# Patient Record
Sex: Male | Born: 1980 | Race: White | Hispanic: No | Marital: Married | Smoking: Never smoker
Health system: Southern US, Community
[De-identification: ages and names within clinical notes are randomized; demographics above are authoritative.]

## PROBLEM LIST (undated history)

## (undated) DIAGNOSIS — R1310 Dysphagia, unspecified: Secondary | ICD-10-CM

## (undated) DIAGNOSIS — K625 Hemorrhage of anus and rectum: Secondary | ICD-10-CM

## (undated) DIAGNOSIS — R7989 Other specified abnormal findings of blood chemistry: Secondary | ICD-10-CM

## (undated) HISTORY — DX: Dysphagia, unspecified: R13.10

## (undated) HISTORY — PX: WISDOM TOOTH EXTRACTION: SHX21

## (undated) HISTORY — DX: Hemorrhage of anus and rectum: K62.5

---

## 2009-12-08 ENCOUNTER — Emergency Department (HOSPITAL_COMMUNITY): Admission: EM | Admit: 2009-12-08 | Discharge: 2009-12-08 | Payer: Self-pay | Admitting: Emergency Medicine

## 2009-12-28 ENCOUNTER — Ambulatory Visit: Payer: Self-pay | Admitting: Internal Medicine

## 2009-12-29 DIAGNOSIS — K921 Melena: Secondary | ICD-10-CM | POA: Insufficient documentation

## 2009-12-29 DIAGNOSIS — R1319 Other dysphagia: Secondary | ICD-10-CM | POA: Insufficient documentation

## 2009-12-30 ENCOUNTER — Ambulatory Visit (HOSPITAL_COMMUNITY): Admission: RE | Admit: 2009-12-30 | Discharge: 2009-12-30 | Payer: Self-pay | Admitting: Internal Medicine

## 2009-12-30 ENCOUNTER — Ambulatory Visit: Payer: Self-pay | Admitting: Internal Medicine

## 2010-02-21 ENCOUNTER — Encounter: Payer: Self-pay | Admitting: Internal Medicine

## 2010-09-27 NOTE — Letter (Signed)
Summary: [facility]/EGD WITH ED ORDER  [facility]/EGD WITH ED ORDER   Imported By: Ave Filter 12/28/2009 11:18:04  _____________________________________________________________________  External Attachment:    Type:   Image     Comment:   External Document

## 2010-09-27 NOTE — Assessment & Plan Note (Signed)
Summary: Esophageal Stricture- cdg   Visit Type:  Initial Visit Primary Care Provider:  none  Chief Complaint:  esoph. stricture.  History of Present Illness: Pleasant 30 year old male Thomas Andrade referred by Thomas Andrade in the ED for further evaluation of a one-year history of progressive esophageal dysphagia to solids. He tells me that over the past one year he has  had more difficulty swallowing solid food, feels food bolus stick behind his breast bone; He's had a Heimlich maneuver performed twice. He's call 911 once. He was evaluated in the ED with plain films. Has not had a GI evaluation/ barium study, etc.;  doesn't have any reflux symptoms prior to this past year; he denies any swallowing difficulties; has not had any abdominal pain, early satiety nausea or vomiting.  He has lost about 10 pounds over the past few months because his diet has really dwindled down to liquids. He rarely consumes alcohol; he does not use tobacco products. He denies family history of GI problems including neoplasia.  Also, patient reports intermittent painless hematochezia. He does work out and [employer] regularly.   Preventive Screening-Counseling & Management  Alcohol-Tobacco     Smoking Status: never  Current Medications (verified): 1)  None  Allergies (verified): 1)  ! * Shellfish 2)  ! Pcn  Past History:  Past Surgical History: none  Family History: Father: htn Mother: breast cancer Siblings: 1 sister No FH of Colon Cancer:  Social History: Marital Status: no Children: no Occupation: [employer] Patient has never smoked.  Alcohol Use - yes- occ. Smoking Status:  never  Vital Signs:  Patient profile:   30 year old male Height:      71 inches Weight:      217 pounds BMI:     30.37 Temp:     98.2 degrees F oral Pulse rate:   68 / minute BP sitting:   128 / 88  (left arm) Cuff size:   regular  Vitals Entered By: Thomas Andrade (Dec 28, 452 10:31  AM)  Physical Exam  General:  alert conversant gentleman come in by his friend Eyes:  no scleral icterus. Conjunctiva are pink Lungs:  clear to auscultation  Heart:  regular rate and rhythm without murmur gallop rub Abdomen:  nondistended positive bowel sounds soft nontender without mass or organomegaly Rectal:  no external lesions good sphincter tone no mass the rectal vault no stool in rectal vault mucus Hemoccult-negative  Impression & Recommendations: Impression: Pleasant 30 year old gentleman with a one-year history of insidiously progressive esophageal dysphagia to solid food. There is a pauscity of reflux symptoms in the history. He likely does have a mechanical lesion producing partial obstruction. Differential diagnosis would include eosinophilic esophagitis(which may be most likely), esophageal web, stricture or exceedingly unlikely neoplastic process.  As a separate issue, he has painless bright red blood per rectum on a regular basis. More likely this is related to benign anorectal problem. Weightlifting may be contributing to this symptom. However, it warrants further evaluation.  Recommendations: Diagnostic EGD with esophageal dilation as appropriate and colonoscopy in the near future the hospital. Risks, benefits, limitations, alternatives and imponderables have been reviewed. His questions have been answered he is agreeable. We'll make further recommendations in the very near future. I want to thank Thomas Andrade for his kind referral.  Appended Document: Orders Update    Clinical Lists Changes  Problems: Added new problem of DYSPHAGIA (ICD-787.29) Added new problem of HEMATOCHEZIA (ICD-578.1) Orders:  Added new Service order of Consultation Level IV [phone]) - Signed

## 2010-09-27 NOTE — Letter (Signed)
Summary: office note-consult from [facility] [place]-dr kozlow   office note-consult from [facility] [place]-dr kozlow   Imported By: Rosine Beat 02/21/2010 10:52:10  _____________________________________________________________________  External Attachment:    Type:   Image     Comment:   External Document

## 2011-10-11 IMAGING — CR DG CHEST 2V
2 series · 2 of 2 positions shown · non-contrast
Comparison: None.

CLINICAL DATA: Chest pain,

CHEST - 2 VIEW

[view not recorded (1 of 2)]
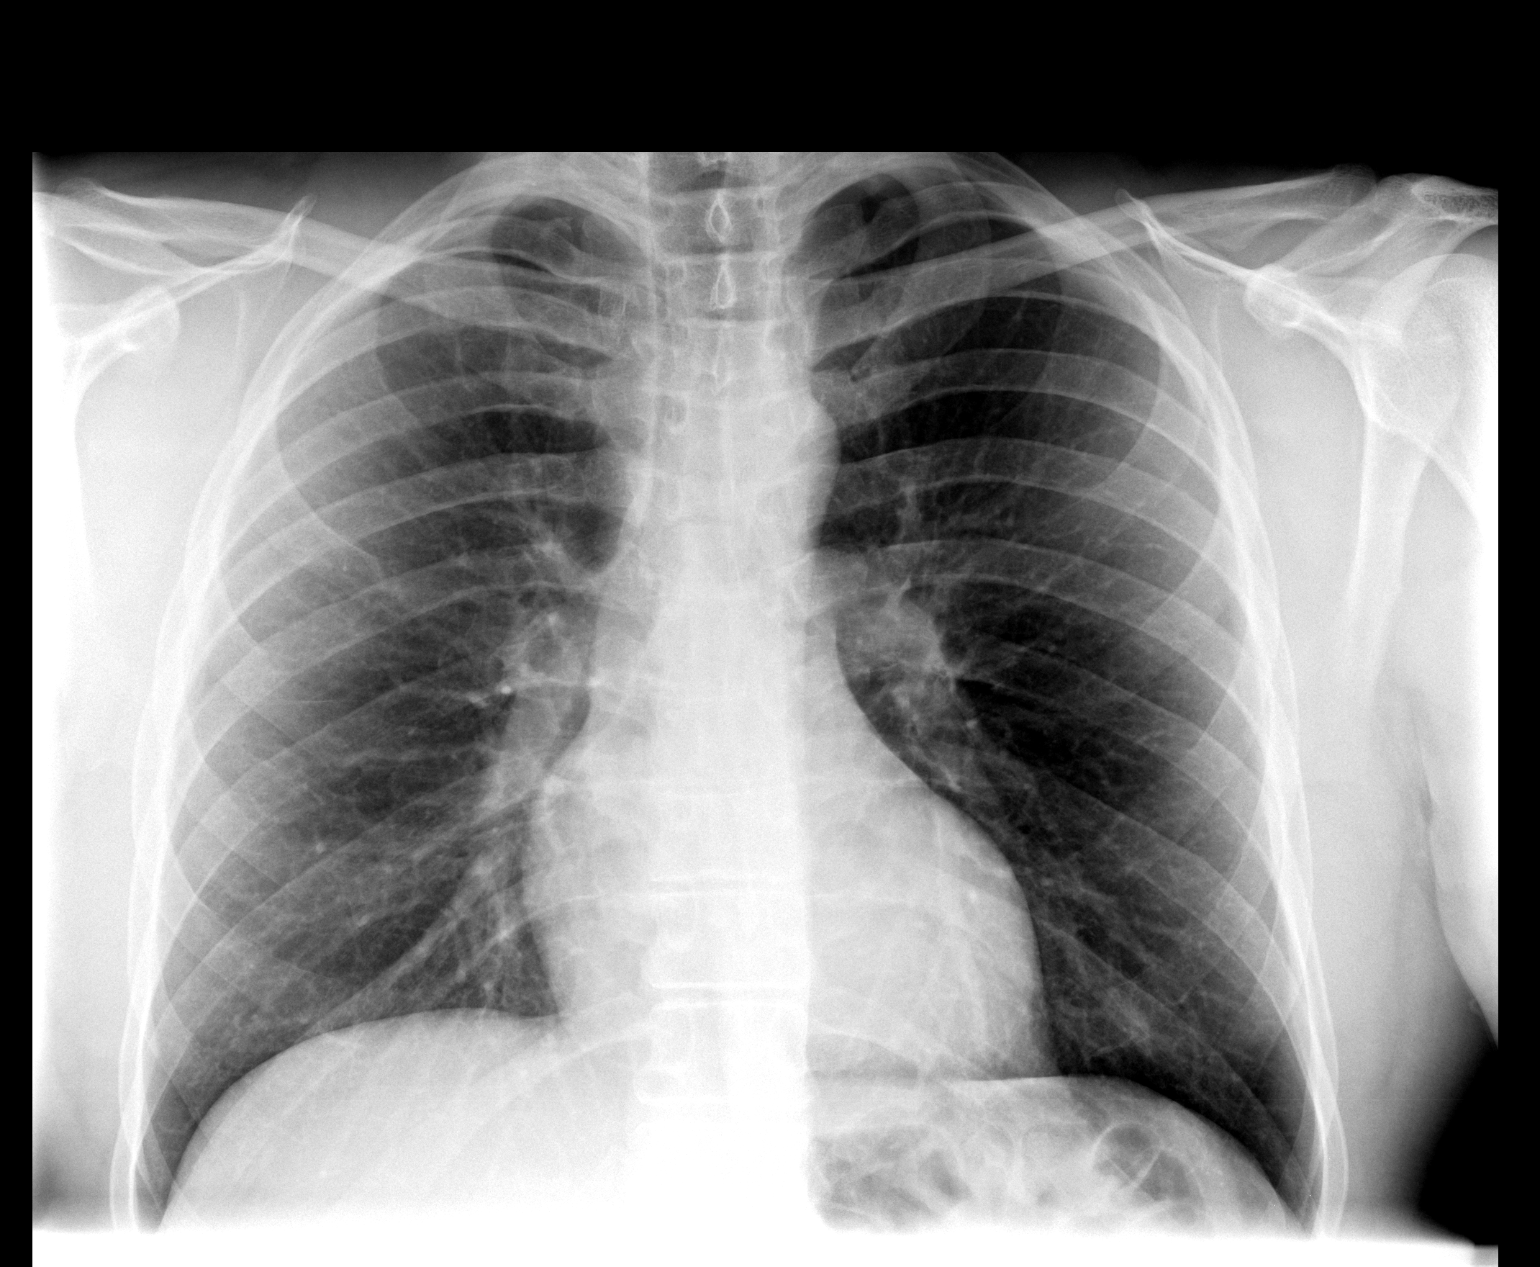

[view not recorded (2 of 2)]
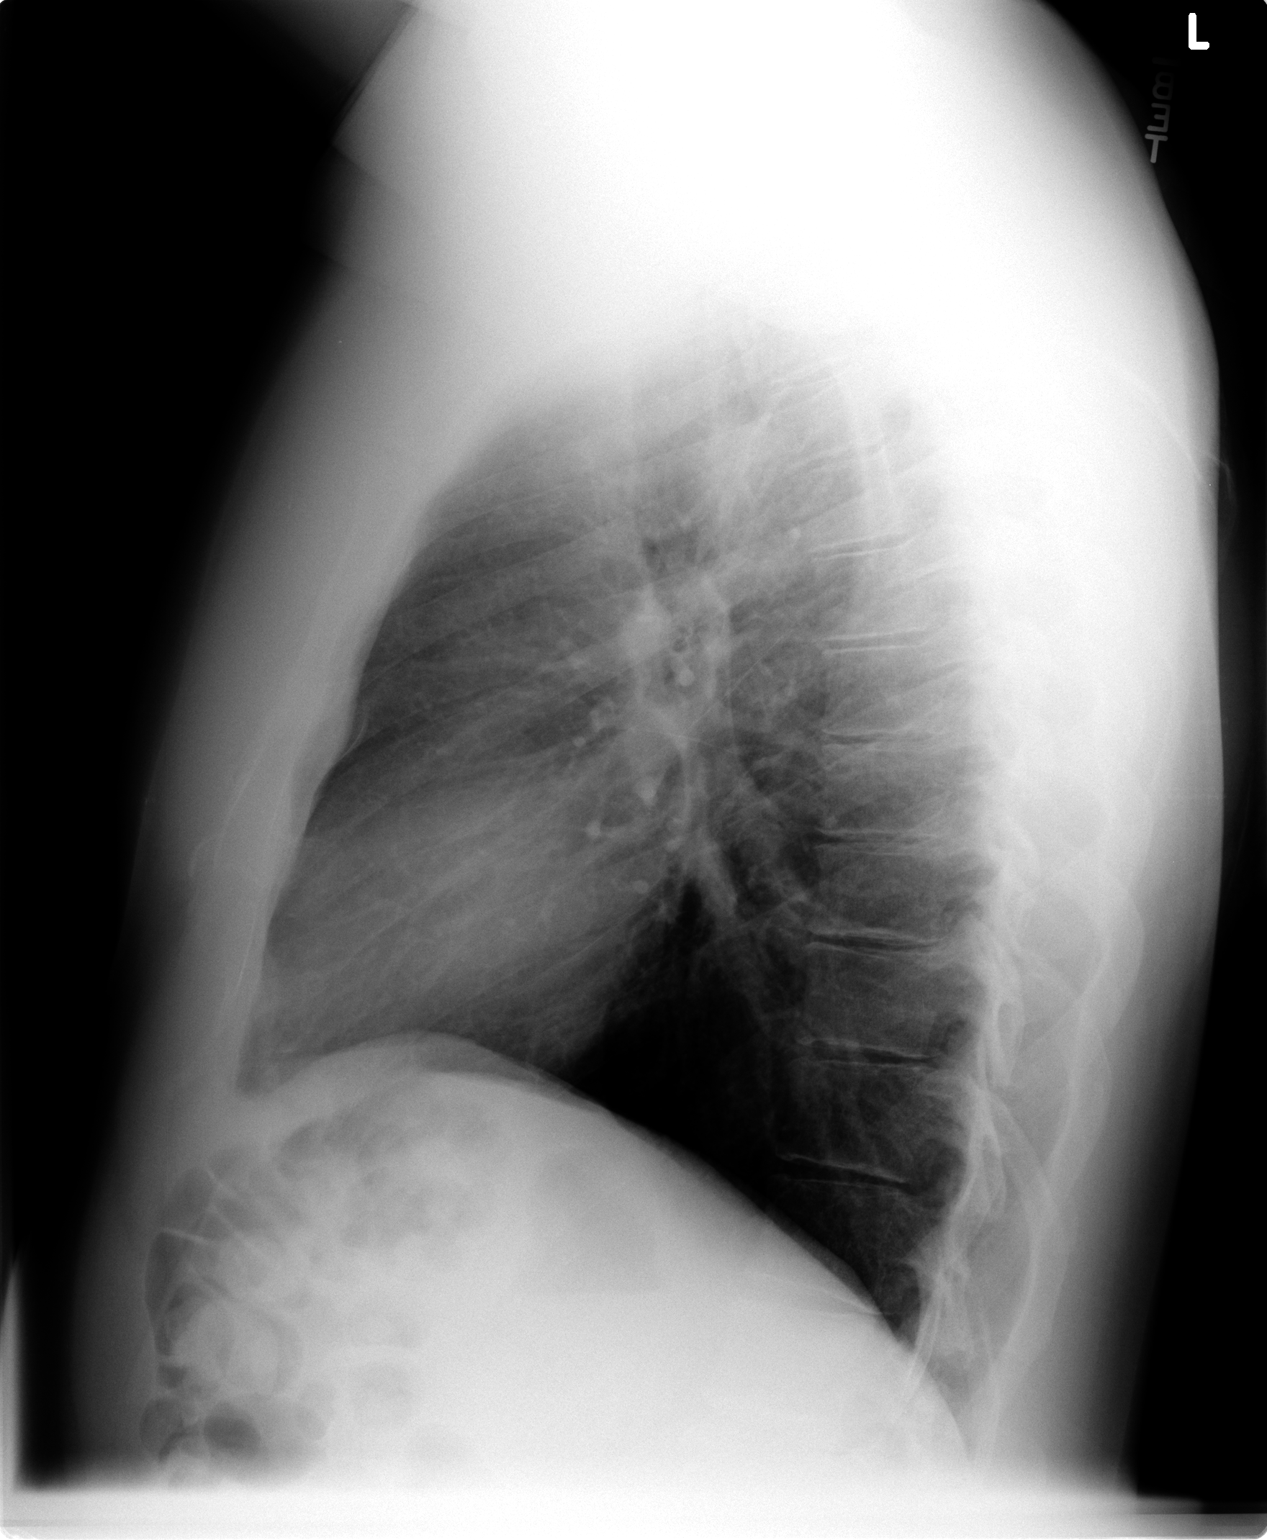

[2 of 2 positions shown; findings below may reference images not displayed]

FINDINGS: Cardiac and mediastinal silhouette appear unremarkable.
Lung fields are clear.  There is no bony abnormality.
IMPRESSION: No active disease.

## 2015-01-06 ENCOUNTER — Encounter (INDEPENDENT_AMBULATORY_CARE_PROVIDER_SITE_OTHER): Payer: Self-pay | Admitting: *Deleted

## 2015-01-28 ENCOUNTER — Encounter (INDEPENDENT_AMBULATORY_CARE_PROVIDER_SITE_OTHER): Payer: Self-pay | Admitting: *Deleted

## 2015-01-28 ENCOUNTER — Encounter (INDEPENDENT_AMBULATORY_CARE_PROVIDER_SITE_OTHER): Payer: Self-pay | Admitting: Internal Medicine

## 2015-01-28 ENCOUNTER — Other Ambulatory Visit (INDEPENDENT_AMBULATORY_CARE_PROVIDER_SITE_OTHER): Payer: Self-pay | Admitting: *Deleted

## 2015-01-28 ENCOUNTER — Ambulatory Visit (INDEPENDENT_AMBULATORY_CARE_PROVIDER_SITE_OTHER): Payer: BLUE CROSS/BLUE SHIELD | Admitting: Internal Medicine

## 2015-01-28 VITALS — BP 100/70 | HR 76 | Temp 97.6°F | Ht 72.0 in | Wt 210.4 lb

## 2015-01-28 DIAGNOSIS — K219 Gastro-esophageal reflux disease without esophagitis: Secondary | ICD-10-CM

## 2015-01-28 DIAGNOSIS — R1314 Dysphagia, pharyngoesophageal phase: Secondary | ICD-10-CM | POA: Diagnosis not present

## 2015-01-28 DIAGNOSIS — R1310 Dysphagia, unspecified: Secondary | ICD-10-CM

## 2015-01-28 MED ORDER — OMEPRAZOLE 40 MG PO CPDR: 40.0000 mg | DELAYED_RELEASE_CAPSULE | Freq: Every day | ORAL | Status: DC

## 2015-01-28 NOTE — Patient Instructions (Signed)
EGD/ED. The risks and benefits such as perforation, bleeding, and infection were reviewed with the patient and is agreeable. 

## 2015-01-28 NOTE — Progress Notes (Signed)
   Subjective:    Patient ID: Thomas Andrade, male    DOB: 1981/05/04, 34 y.o.   MRN: [id number]  HPI Referred by Dr. Patrecia PaceMorayati for GERD/EGD. In April he had pain when he drank water or food. He said the pain came out of no where. He saw Dr. Patrecia PaceMorayati (PCP). The pain lasted for 7-10 days and then resolved. He was given Dexilant which helped. He says he feels 100% better. He says his acid reflux is controlled with Zantac. He says no foods are lodging in his esophagus. Foods however are slow to go down . He is chewing is food well. He is trying to eat healthier. Appetite is good. No weight loss.  His BM are normal.  EGD/Colonoscopy in 2011 for dysphagia, GERD and rectal bleeding.   Impression: Somewhat ringed esophageal mucosa with some longitudinal furrows and extensive linear distal esophageal erosions. Picture consistent with erosive reflux esophagitis.  Also findings consistent with eosinophilic esophagitis status post passage of a Maloney dilator and esophageal biopsy. Small hiatal hernia.  Colonoscopy: friable anal canal with anal papilla, hematochezia is related to straining in the setting of weight lifting.  Biopsy:   1. ESOPHAGUS, BIOPSY, : - BENIGN SQUAMOUS MUCOSA ASSOCIATED WITH PROMINENT INCREASE IN EOSINOPHILS AND REACTIVE EPITHELIAL CHANGES. - no intestinal metaplasia or malignancy identified.   12/09/2014 ALP 58, AST 18, ALT 16, Iron 55, TSH 1.580, Thyroxine 7.9, T3 uptake 26, Free Thyroxine 26./ H and H 16.6 and 47.8,     Review of Systems Single, no children. Works as a Warden/rangercontractor with [employer]    Past Medical History  Diagnosis Date  . Dysphagia   . Rectal bleeding     No past surgical history on file.  Allergies  Allergen Reactions  . Penicillins     No current outpatient prescriptions on file prior to visit.   No current facility-administered medications on file prior to visit.     Objective:   Physical Exam Blood pressure 110/60, pulse 76, temperature  97.6 F (36.4 C), height 6' (1.829 m), weight 210 lb 6.4 oz (95.437 kg). Alert and oriented. Skin warm and dry. Oral mucosa is moist.   . Sclera anicteric, conjunctivae is pink. Thyroid not enlarged. No cervical lymphadenopathy. Lungs clear. Heart regular rate and rhythm.  Abdomen is soft. Bowel sounds are positive. No hepatomegaly. No abdominal masses felt. No tenderness.  No edema to lower extremities.          Assessment & Plan:  Solid food dysphagia. Esophageal stricture needs to be ruled out. EGD/ED. Hx of esinophilic esophagitis.  GERD: Omeprazole 40mg  daily. May take Zantac at night as needed.  Rx for Omeprazole 40mg  daily. Zantac at night as needed.

## 2015-01-29 ENCOUNTER — Encounter (INDEPENDENT_AMBULATORY_CARE_PROVIDER_SITE_OTHER): Payer: Self-pay

## 2015-02-01 ENCOUNTER — Ambulatory Visit (HOSPITAL_COMMUNITY)
Admission: RE | Admit: 2015-02-01 | Discharge: 2015-02-01 | Disposition: A | Discharge status: Home / Self Care | Payer: BLUE CROSS/BLUE SHIELD | Source: Ambulatory Visit | Attending: Internal Medicine | Admitting: Internal Medicine

## 2015-02-01 ENCOUNTER — Encounter (HOSPITAL_COMMUNITY)
Admission: RE | Disposition: A | Discharge status: Home / Self Care | Payer: Self-pay | Source: Ambulatory Visit | Attending: Internal Medicine

## 2015-02-01 ENCOUNTER — Encounter (HOSPITAL_COMMUNITY): Payer: Self-pay

## 2015-02-01 DIAGNOSIS — K200 Eosinophilic esophagitis: Secondary | ICD-10-CM | POA: Insufficient documentation

## 2015-02-01 DIAGNOSIS — K208 Other esophagitis: Secondary | ICD-10-CM | POA: Diagnosis not present

## 2015-02-01 DIAGNOSIS — R1310 Dysphagia, unspecified: Secondary | ICD-10-CM | POA: Diagnosis present

## 2015-02-01 DIAGNOSIS — Z880 Allergy status to penicillin: Secondary | ICD-10-CM | POA: Insufficient documentation

## 2015-02-01 DIAGNOSIS — K228 Other specified diseases of esophagus: Secondary | ICD-10-CM | POA: Insufficient documentation

## 2015-02-01 DIAGNOSIS — K222 Esophageal obstruction: Secondary | ICD-10-CM | POA: Insufficient documentation

## 2015-02-01 DIAGNOSIS — K449 Diaphragmatic hernia without obstruction or gangrene: Secondary | ICD-10-CM | POA: Insufficient documentation

## 2015-02-01 DIAGNOSIS — R1312 Dysphagia, oropharyngeal phase: Secondary | ICD-10-CM | POA: Diagnosis not present

## 2015-02-01 DIAGNOSIS — Z8719 Personal history of other diseases of the digestive system: Secondary | ICD-10-CM | POA: Diagnosis not present

## 2015-02-01 HISTORY — PX: ESOPHAGEAL DILATION: SHX303

## 2015-02-01 HISTORY — PX: ESOPHAGOGASTRODUODENOSCOPY: SHX5428

## 2015-02-01 LAB — KOH PREP: KOH Prep: NONE SEEN

## 2015-02-01 SURGERY — EGD (ESOPHAGOGASTRODUODENOSCOPY)
Anesthesia: Moderate Sedation

## 2015-02-01 MED ORDER — MEPERIDINE HCL 50 MG/ML IJ SOLN
INTRAMUSCULAR | Status: AC
  Filled 2015-02-01: qty 1

## 2015-02-01 MED ORDER — MIDAZOLAM HCL 5 MG/5ML IJ SOLN
INTRAMUSCULAR | Status: DC | PRN
  Administered 2015-02-01: 3 mg via INTRAVENOUS
  Administered 2015-02-01: 2 mg via INTRAVENOUS
  Administered 2015-02-01: 3 mg via INTRAVENOUS
  Administered 2015-02-01 (×2): 2 mg via INTRAVENOUS
  Administered 2015-02-01: 3 mg via INTRAVENOUS

## 2015-02-01 MED ORDER — MIDAZOLAM HCL 5 MG/5ML IJ SOLN
INTRAMUSCULAR | Status: AC
  Filled 2015-02-01: qty 5

## 2015-02-01 MED ORDER — BUTAMBEN-TETRACAINE-BENZOCAINE 2-2-14 % EX AERO
INHALATION_SPRAY | CUTANEOUS | Status: DC | PRN
  Administered 2015-02-01: 2 via TOPICAL

## 2015-02-01 MED ORDER — MIDAZOLAM HCL 5 MG/5ML IJ SOLN
INTRAMUSCULAR | Status: AC
  Filled 2015-02-01: qty 10

## 2015-02-01 MED ORDER — RANITIDINE HCL 150 MG PO TABS: 150.0000 mg | ORAL_TABLET | Freq: Every day | ORAL | Status: DC | PRN

## 2015-02-01 MED ORDER — MEPERIDINE HCL 50 MG/ML IJ SOLN
INTRAMUSCULAR | Status: DC | PRN
  Administered 2015-02-01 (×4): 25 mg via INTRAVENOUS

## 2015-02-01 MED ORDER — SIMETHICONE 40 MG/0.6ML PO SUSP
ORAL | Status: DC | PRN
  Administered 2015-02-01: 12:00:00

## 2015-02-01 NOTE — H&P (Signed)
Thomas Andrade is an 34 y.o. male.   Chief Complaint: Patient's here for EGD and ED. HPI: Patient is 34 year old Caucasian male who was diagnosed with eosinophilic esophagitis By Dr. Jena Gaussourk back in May 2011. His esophagus was dilated with 52 JamaicaFrench Maloney dilator. He was subsequently treated with topical steroid-induced and did fine until about 2 months ago when he developed chest pain, odynophagia and dysphagia. This period lasted for 10 days. He lost 10 or 12 pounds and since then pain has resolved but he is still having dysphagia with solids particularly meats. He has changes eating habits. Since then his heartburn has been well controlled with Zantac. He denies nausea vomiting abdominal pain or melena.    Past Medical History  Diagnosis Date  . Dysphagia   . Rectal bleeding     History reviewed. No pertinent past surgical history.  History reviewed. No pertinent family history. Social History:  reports that he has never smoked. He does not have any smokeless tobacco history on file. He reports that he drinks alcohol. He reports that he does not use illicit drugs.  Allergies:  Allergies  Allergen Reactions  . Penicillins     Medications Prior to Admission  Medication Sig Dispense Refill  . ranitidine (ZANTAC) 300 MG tablet Take 300 mg by mouth as needed for heartburn.    Marland Kitchen. omeprazole (PRILOSEC) 40 MG capsule Take 1 capsule (40 mg total) by mouth daily. 30 capsule 3    No results found for this or any previous visit (from the past 48 hour(s)). No results found.  ROS  Blood pressure 138/80, pulse 71, temperature 98.1 F (36.7 C), temperature source Oral, resp. rate 18, height 6' (1.829 m), weight 210 lb (95.255 kg), SpO2 98 %. Physical Exam  Constitutional: He appears well-developed and well-nourished.  HENT:  Mouth/Throat: Oropharynx is clear and moist.  Eyes: Conjunctivae are normal. No scleral icterus.  Neck: No thyromegaly present.  Cardiovascular: Normal rate, regular  rhythm and normal heart sounds.   No murmur heard. Respiratory: Effort normal.  GI: Soft. He exhibits no distension and no mass. There is no tenderness.  Musculoskeletal: He exhibits no edema.  Lymphadenopathy:    He has no cervical adenopathy.  Neurological: He is alert.  Skin: Skin is warm and dry.     Assessment/Plan Solid food dysphagia in a patient with history of eosinophilic esophagitis. Recent bout of chest pain and odynophagia. EGD with ED.  Melysa Schroyer U 02/01/2015, 11:35 AM

## 2015-02-01 NOTE — Op Note (Signed)
EGD PROCEDURE REPORT  PATIENT:  Thomas Andrade  MR#:  [id number] Birthdate:  12/21/1980, 34 y.o., male Endoscopist:  Dr. Malissa HippoNajeeb U. Melysa Schroyer, MD Referred By:  Dr. Horton ChinShamil Morayati, MD Procedure Date: 02/01/2015  Procedure:   EGD with ED  Indications:  A she was 34 year old Caucasian male who was diagnosed with eosinophilic esophagitis back in April 2011 when he presented with dysphagia. Esophagus was dilated with 52 French Maloney dilator(Dr. Rourk). He did fine until about 2 months ago when he developed chest pain and oynophagia and for a pair of 10 days he was only able to swallow liquids. He lost about 12 pounds. Since then he has changed his eating habits and is on Zantac. Acute symptoms have resolved but he still having some discomfort and dysphagia primarily with solids. He is undergoing diagnostic/therapeutic EGD.            Informed Consent:  The risks, benefits, alternatives & imponderables which include, but are not limited to, bleeding, infection, perforation, drug reaction and potential missed lesion have been reviewed.  The potential for biopsy, lesion removal, esophageal dilation, etc. have also been discussed.  Questions have been answered.  All parties agreeable.  Please see history & physical in medical record for more information.  Medications:  Demerol 100 mg IV Versed 15 mg IV Cetacaine spray topically for oropharyngeal anesthesia  Description of procedure:  The endoscope was introduced through the mouth and advanced to the second portion of the duodenum without difficulty or limitations. The mucosal surfaces were surveyed very carefully during advancement of the scope and upon withdrawal.  Findings:  Esophagus:  Patchy cheesy exudate noted coating mucosa of proximal segment of the esophagus. Multiple circumferential rings noted at midesophagus with noncritical narrowing. Multiple erosions noted within the distal 3 cm of esophagus. GE junction unremarkable without ring or stricture  formation. GEJ:  39 cm Hiatus:  41 cm Stomach:  Stomach was empty and distended very well with insufflation. Folds in the proximal stomach were normal. Examination of mucosa at gastric body, antrum, pyloric channel, adverse fundus and cardia was normal. Duodenum:  Normal bulbar and post bulbar mucosa.  Therapeutic/Diagnostic Maneuvers Performed:   Stricture at mid esophagus was dilated with a balloon dilator. Balloon dilator was advanced to the scope. Guidewire was pushed to gastric lumen. Balloon.was placed across the stricture and slowly insufflated to a diameter of 15 mm but no mucosal disruption noted. Balloon was deflated and repositioned and insufflated diameter of 16.5 mm resulting in three boat shaped mucosal disruptions at 3 different levels. Balloon was deflated and withdrawn. Biopsy was taken from esophageal mucosa for routine histology along with brushing from proximal esophagus for KOH prep.   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 The stricture was dilated with a balloon from 15 to 16.5 mm resulting in mucosal disruption. Multiple erosions involving distal 3 cm of esophageal mucosa and small sliding hiatal hernia. Brushing taken from proximal esophagus for KOH prep  Recommendations:  Standard instructions given. Patient advised to take omeprazole 40 mg by mouth every morning. Zantac OTC 150 mg by mouth daily at bedtime when necessary. I will be contacting patient results of brushing cytology and biopsy and further recommendations.  Erionna Strum U  02/01/2015  12:15 PM  CC: Dr. Alan MulderMORAYATI,SHAMIL J, MD & Dr. Bonnetta BarryNo ref. provider found

## 2015-02-01 NOTE — Discharge Instructions (Signed)
Take omeprazole 40 mg by mouth 30 minutes before breakfast daily. Take Ranitidine/ Zantac OTC 150 mg by mouth every evening or at bedtime on as-needed basis. Soft foods for 24 hours then usual diet. No driving for 24 hours. Physician will call with results of biopsy and brushing  Esophagogastroduodenoscopy Esophagogastroduodenoscopy (EGD) is a procedure to examine the lining of the esophagus, stomach, and first part of the small intestine (duodenum). A long, flexible, lighted tube with a camera attached (endoscope) is inserted down the throat to view these organs. This procedure is done to detect problems or abnormalities, such as inflammation, bleeding, ulcers, or growths, in order to treat them. The procedure lasts about 5-20 minutes. It is usually an outpatient procedure, but it may need to be performed in emergency cases in the hospital. LET YOUR CAREGIVER KNOW ABOUT:   Allergies to food or medicine.  All medicines you are taking, including vitamins, herbs, eyedrops, and over-the-counter medicines and creams.  Use of steroids (by mouth or creams).  Previous problems you or members of your family have had with the use of anesthetics.  Any blood disorders you have.  Previous surgeries you have had.  Other health problems you have.  Possibility of pregnancy, if this applies. RISKS AND COMPLICATIONS  Generally, EGD is a safe procedure. However, as with any procedure, complications can occur. Possible complications include:  Infection.  Bleeding.  Tearing (perforation) of the esophagus, stomach, or duodenum.  Difficulty breathing or not being able to breath.  Excessive sweating.  Spasms of the larynx.  Slowed heartbeat.  Low blood pressure. BEFORE THE PROCEDURE  Do not eat or drink anything for 6-8 hours before the procedure or as directed by your caregiver.  Ask your caregiver about changing or stopping your regular medicines.  If you wear dentures, be prepared to  remove them before the procedure.  Arrange for someone to drive you home after the procedure. PROCEDURE   A vein will be accessed to give medicines and fluids. A medicine to relax you (sedative) and a pain reliever will be given through that access into the vein.  A numbing medicine (local anesthetic) may be sprayed on your throat for comfort and to stop you from gagging or coughing.  A mouth guard may be placed in your mouth to protect your teeth and to keep you from biting on the endoscope.  You will be asked to lie on your left side.  The endoscope is inserted down your throat and into the esophagus, stomach, and duodenum.  Air is put through the endoscope to allow your caregiver to view the lining of your esophagus clearly.  The esophagus, stomach, and duodenum is then examined. During the exam, your caregiver may:  Remove tissue to be examined under a microscope (biopsy) for inflammation, infection, or other medical problems.  Remove growths.  Remove objects (foreign bodies) that are stuck.  Treat any bleeding with medicines or other devices that stop tissues from bleeding (hot cautery, clipping devices).  Widen (dilate) or stretch narrowed areas of the esophagus and stomach.  The endoscope will then be withdrawn. AFTER THE PROCEDURE  You will be taken to a recovery area to be monitored. You will be able to go home once you are stable and alert.  Do not eat or drink anything until the local anesthetic and numbing medicines have worn off. You may choke.  It is normal to feel bloated, have pain with swallowing, or have a sore throat for a short time. This  will wear off.  Your caregiver should be able to discuss his or her findings with you. It will take longer to discuss the test results if any biopsies were taken. Document Released: 12/15/2004 Document Revised: 12/29/2013 Document Reviewed: 07/17/2012 [facility] Patient Information 2015 [employer]. This information is  not intended to replace advice given to you by your health care provider. Make sure you discuss any questions you have with your health care provider.

## 2015-02-02 ENCOUNTER — Encounter (HOSPITAL_COMMUNITY): Payer: Self-pay | Admitting: Internal Medicine

## 2015-02-05 ENCOUNTER — Telehealth (INDEPENDENT_AMBULATORY_CARE_PROVIDER_SITE_OTHER): Payer: Self-pay | Admitting: Internal Medicine

## 2015-02-05 ENCOUNTER — Telehealth (INDEPENDENT_AMBULATORY_CARE_PROVIDER_SITE_OTHER): Payer: Self-pay | Admitting: *Deleted

## 2015-02-05 ENCOUNTER — Encounter (INDEPENDENT_AMBULATORY_CARE_PROVIDER_SITE_OTHER): Payer: Self-pay | Admitting: *Deleted

## 2015-02-05 MED ORDER — FLUTICASONE PROPIONATE HFA 220 MCG/ACT IN AERO: 4.0000 | INHALATION_SPRAY | Freq: Two times a day (BID) | RESPIRATORY_TRACT | Status: DC

## 2015-02-05 NOTE — Telephone Encounter (Signed)
Results reviewed with patient,s girlfriend Chelsey because he never answers his phone. Prescription ofr Fluticasone sent to his pharmacy

## 2015-02-05 NOTE — Telephone Encounter (Signed)
A letter has been sent to the patient asking he call the office for results. Several attempts have been made to reach him by phone.

## 2015-02-16 ENCOUNTER — Other Ambulatory Visit (INDEPENDENT_AMBULATORY_CARE_PROVIDER_SITE_OTHER): Payer: Self-pay | Admitting: Internal Medicine

## 2015-02-16 ENCOUNTER — Encounter (INDEPENDENT_AMBULATORY_CARE_PROVIDER_SITE_OTHER): Payer: Self-pay | Admitting: *Deleted

## 2015-02-16 DIAGNOSIS — K222 Esophageal obstruction: Secondary | ICD-10-CM

## 2015-02-16 NOTE — Progress Notes (Signed)
BPE sch'd 03/22/15 at 845, npo after midnight and 8 week OV sch's 03/29/15 at 930 with Terri, letter mailed to patient with both appt

## 2015-02-16 NOTE — Progress Notes (Signed)
procedure note and pathology result letter faxed to PCP

## 2015-03-22 ENCOUNTER — Inpatient Hospital Stay (HOSPITAL_COMMUNITY): Admission: RE | Admit: 2015-03-22 | Payer: BLUE CROSS/BLUE SHIELD | Source: Ambulatory Visit

## 2015-03-29 ENCOUNTER — Ambulatory Visit (INDEPENDENT_AMBULATORY_CARE_PROVIDER_SITE_OTHER): Payer: BLUE CROSS/BLUE SHIELD | Admitting: Internal Medicine

## 2015-04-28 ENCOUNTER — Encounter (INDEPENDENT_AMBULATORY_CARE_PROVIDER_SITE_OTHER): Payer: Self-pay | Admitting: *Deleted

## 2021-05-25 ENCOUNTER — Other Ambulatory Visit: Payer: Self-pay

## 2021-05-25 ENCOUNTER — Ambulatory Visit: Payer: BC Managed Care – PPO | Admitting: Nurse Practitioner

## 2021-05-25 ENCOUNTER — Encounter: Payer: Self-pay | Admitting: Nurse Practitioner

## 2021-05-25 VITALS — BP 129/84 | HR 82 | Temp 98.7°F | Ht 71.0 in | Wt 247.0 lb

## 2021-05-25 DIAGNOSIS — Z139 Encounter for screening, unspecified: Secondary | ICD-10-CM

## 2021-05-25 DIAGNOSIS — R5383 Other fatigue: Secondary | ICD-10-CM | POA: Insufficient documentation

## 2021-05-25 DIAGNOSIS — Z9109 Other allergy status, other than to drugs and biological substances: Secondary | ICD-10-CM | POA: Diagnosis not present

## 2021-05-25 DIAGNOSIS — Z7689 Persons encountering health services in other specified circumstances: Secondary | ICD-10-CM | POA: Diagnosis not present

## 2021-05-25 DIAGNOSIS — R5382 Chronic fatigue, unspecified: Secondary | ICD-10-CM | POA: Diagnosis not present

## 2021-05-25 NOTE — Progress Notes (Signed)
New Patient Office Visit  Subjective:  Patient ID: Thomas Andrade, male    DOB: 11-09-1980  Age: 40 y.o. MRN: [id number]  CC:  Chief Complaint  Patient presents with   New Patient (Initial Visit)    Establish care wants blood work due to being tired and fatigued     HPI Thomas Andrade presents for new patient visit. No recent PCP.   He is having fatigue that started "for as longa s I can remember" but worse in the last year. Denies problems sleeping, but states he can sleep 12 hours at a time and feels worse when he sleeps for a long time. His PHQ-9 = 14.  He had GERD and had dysphagia and had dilation with Dr. Gala Romney (he believes).   Past Medical History:  Diagnosis Date   Dysphagia    Rectal bleeding     Past Surgical History:  Procedure Laterality Date   ESOPHAGEAL DILATION N/A 02/01/2015   Procedure: ESOPHAGEAL DILATION;  Surgeon: Rogene Houston, MD;  Location: [facility];  Service: Endoscopy;  Laterality: N/A;   ESOPHAGOGASTRODUODENOSCOPY N/A 02/01/2015   Procedure: ESOPHAGOGASTRODUODENOSCOPY (EGD);  Surgeon: Rogene Houston, MD;  Location: [facility];  Service: Endoscopy;  Laterality: N/A;  [place] EXTRACTION      Family History  Problem Relation Age of Onset   Cancer Maternal Aunt    Cancer Paternal Aunt     Social History   Socioeconomic History   Marital status: Single    Spouse name: Not on file   Number of children: 0   Years of education: Not on file   Highest education level: Not on file  Occupational History   Occupation: [employer] in [place]  Tobacco Use   Smoking status: Never   Smokeless tobacco: Current    Types: Chew  Vaping Use   Vaping Use: Never used  Substance and Sexual Activity   Alcohol use: Yes    Alcohol/week: 3.0 - 4.0 standard drinks    Types: 3 - 4 Cans of beer per week    Comment: occasionally   Drug use: No   Sexual activity: Yes  Other Topics Concern   Not on file  Social History Narrative   Not on  file   Social Determinants of Health   Financial Resource Strain: Not on file  Food Insecurity: Not on file  Transportation Needs: Not on file  Physical Activity: Not on file  Stress: Not on file  Social Connections: Not on file  Intimate Partner Violence: Not on file    ROS Review of Systems  Constitutional:  Positive for fatigue. Negative for activity change, appetite change, chills and fever.  Respiratory: Negative.    Cardiovascular: Negative.   Musculoskeletal: Negative.   Psychiatric/Behavioral:  Negative for dysphoric mood, self-injury, sleep disturbance and suicidal ideas. The patient is not nervous/anxious.    Objective:   Today's Vitals: BP 129/84 (BP Location: Right Arm, Patient Position: Sitting, Cuff Size: Large)   Pulse 82   Temp 98.7 F (37.1 C) (Oral)   Ht 5' 11"  (1.803 m)   Wt 247 lb (112 kg)   SpO2 93%   BMI 34.45 kg/m   Physical Exam Constitutional:      Appearance: Normal appearance.  Cardiovascular:     Rate and Rhythm: Normal rate and regular rhythm.     Pulses: Normal pulses.     Heart sounds: Normal heart sounds.  Pulmonary:     Effort: Pulmonary  effort is normal.     Breath sounds: Normal breath sounds.  Musculoskeletal:        General: Normal range of motion.  Neurological:     Mental Status: He is alert.  Psychiatric:        Mood and Affect: Mood normal.        Behavior: Behavior normal.        Thought Content: Thought content normal.        Judgment: Judgment normal.     Comments: PHQ-9 = 14    Assessment & Plan:   Problem List Items Addressed This Visit       Other   Establishing care with new doctor, encounter for - Primary    -obtain records      Relevant Orders   CBC with Differential/Platelet   CMP14+EGFR   Lipid Panel With LDL/HDL Ratio   TSH   Environmental allergies    -allergies to some pollen -took flovent when he was having dysphagia, but he had esophageal dilation and has been better, and hasn't needed  ICS      Fatigue    -ongoing for years, but worse in the last year -check routine labs plus thyroid and testosterone panel; would add B-12 and folate if macrocytic anemia present -he states he can sleep up to 12 hours at a time, and PHQ-9 = 14 indicating moderate depression -if labs are pan-negative; would consider adding antidepressant      Relevant Orders   Testosterone, Free, Total, SHBG   Other Visit Diagnoses     Screening due       Relevant Orders   Hepatitis C antibody   HIV Antibody (routine testing w rflx)       Outpatient Encounter Medications as of 05/25/2021  Medication Sig   [DISCONTINUED] fluticasone (FLOVENT HFA) 220 MCG/ACT inhaler Inhale 4 puffs into the lungs 2 (two) times daily. (Patient not taking: Reported on 05/25/2021)   [DISCONTINUED] omeprazole (PRILOSEC) 40 MG capsule Take 1 capsule (40 mg total) by mouth daily. (Patient not taking: Reported on 05/25/2021)   [DISCONTINUED] ranitidine (ZANTAC) 150 MG tablet Take 1 tablet (150 mg total) by mouth daily as needed for heartburn. (Patient not taking: Reported on 05/25/2021)   No facility-administered encounter medications on file as of 05/25/2021.    Follow-up: Return in about 3 months (around 08/24/2021) for Physical Exam.   Noreene Larsson, NP

## 2021-05-25 NOTE — Assessment & Plan Note (Signed)
-  obtain records 

## 2021-05-25 NOTE — Assessment & Plan Note (Signed)
-  ongoing for years, but worse in the last year -check routine labs plus thyroid and testosterone panel; would add B-12 and folate if macrocytic anemia present -he states he can sleep up to 12 hours at a time, and PHQ-9 = 14 indicating moderate depression -if labs are pan-negative; would consider adding antidepressant

## 2021-05-25 NOTE — Patient Instructions (Addendum)
Please have fasting labs drawn in the next 7 days. 

## 2021-05-25 NOTE — Assessment & Plan Note (Signed)
-  allergies to some pollen -took flovent when he was having dysphagia, but he had esophageal dilation and has been better, and hasn't needed ICS

## 2021-05-31 NOTE — Progress Notes (Signed)
Labs look great. Cholesterol is slightly elevated, so try cutting back on fried/fatty foods. Otherwise, no obvious cause of fatigue found on labs.

## 2021-06-01 LAB — CBC WITH DIFFERENTIAL/PLATELET
Basophils Absolute: 0 10*3/uL (ref 0.0–0.2)
Basos: 1 %
EOS (ABSOLUTE): 0.2 10*3/uL (ref 0.0–0.4)
Eos: 3 %
Hematocrit: 43.3 % (ref 37.5–51.0)
Hemoglobin: 14.9 g/dL (ref 13.0–17.7)
Immature Grans (Abs): 0 10*3/uL (ref 0.0–0.1)
Immature Granulocytes: 0 %
Lymphocytes Absolute: 2.2 10*3/uL (ref 0.7–3.1)
Lymphs: 32 %
MCH: 32.3 pg (ref 26.6–33.0)
MCHC: 34.4 g/dL (ref 31.5–35.7)
MCV: 94 fL (ref 79–97)
Monocytes Absolute: 0.4 10*3/uL (ref 0.1–0.9)
Monocytes: 6 %
Neutrophils Absolute: 3.9 10*3/uL (ref 1.4–7.0)
Neutrophils: 58 %
Platelets: 261 10*3/uL (ref 150–450)
RBC: 4.62 x10E6/uL (ref 4.14–5.80)
RDW: 12.8 % (ref 11.6–15.4)
WBC: 6.7 10*3/uL (ref 3.4–10.8)

## 2021-06-01 LAB — CMP14+EGFR
ALT: 22 IU/L (ref 0–44)
AST: 21 IU/L (ref 0–40)
Albumin/Globulin Ratio: 1.6 (ref 1.2–2.2)
Albumin: 4.5 g/dL (ref 4.0–5.0)
Alkaline Phosphatase: 57 IU/L (ref 44–121)
BUN/Creatinine Ratio: 13 (ref 9–20)
BUN: 13 mg/dL (ref 6–24)
Bilirubin Total: 0.7 mg/dL (ref 0.0–1.2)
CO2: 25 mmol/L (ref 20–29)
Calcium: 9.3 mg/dL (ref 8.7–10.2)
Chloride: 105 mmol/L (ref 96–106)
Creatinine, Ser: 1.01 mg/dL (ref 0.76–1.27)
Globulin, Total: 2.8 g/dL (ref 1.5–4.5)
Glucose: 79 mg/dL (ref 70–99)
Potassium: 4.2 mmol/L (ref 3.5–5.2)
Sodium: 143 mmol/L (ref 134–144)
Total Protein: 7.3 g/dL (ref 6.0–8.5)
eGFR: 95 mL/min/{1.73_m2} (ref >59)

## 2021-06-01 LAB — HEPATITIS C ANTIBODY: Hep C Virus Ab: <0.1 s/co ratio (ref 0.0–0.9)

## 2021-06-01 LAB — TESTOSTERONE, FREE, TOTAL, SHBG
Sex Hormone Binding: 26.3 nmol/L (ref 16.5–55.9)
Testosterone, Free: 13.4 pg/mL (ref 6.8–21.5)
Testosterone: 376 ng/dL (ref 264–916)

## 2021-06-01 LAB — LIPID PANEL WITH LDL/HDL RATIO
Cholesterol, Total: 204 mg/dL — ABNORMAL HIGH (ref 100–199)
HDL: 55 mg/dL (ref >39)
LDL Chol Calc (NIH): 127 mg/dL — ABNORMAL HIGH (ref 0–99)
LDL/HDL Ratio: 2.3 ratio (ref 0.0–3.6)
Triglycerides: 122 mg/dL (ref 0–149)
VLDL Cholesterol Cal: 22 mg/dL (ref 5–40)

## 2021-06-01 LAB — TSH: TSH: 2.06 u[IU]/mL (ref 0.450–4.500)

## 2021-06-01 LAB — HIV ANTIBODY (ROUTINE TESTING W REFLEX): HIV Screen 4th Generation wRfx: NONREACTIVE

## 2021-06-14 ENCOUNTER — Telehealth: Payer: Self-pay | Admitting: Nurse Practitioner

## 2021-06-14 NOTE — Telephone Encounter (Signed)
Called pt to discuss bill, no answer

## 2021-07-25 ENCOUNTER — Encounter (HOSPITAL_COMMUNITY): Payer: Self-pay | Admitting: Emergency Medicine

## 2021-07-25 ENCOUNTER — Other Ambulatory Visit: Payer: Self-pay

## 2021-07-25 ENCOUNTER — Ambulatory Visit (HOSPITAL_COMMUNITY)
Admission: EM | Admit: 2021-07-25 | Discharge: 2021-07-25 | Disposition: A | Discharge status: Home / Self Care | Payer: BC Managed Care – PPO | Attending: Emergency Medicine | Admitting: Emergency Medicine

## 2021-07-25 DIAGNOSIS — J09X2 Influenza due to identified novel influenza A virus with other respiratory manifestations: Secondary | ICD-10-CM | POA: Diagnosis not present

## 2021-07-25 DIAGNOSIS — R509 Fever, unspecified: Secondary | ICD-10-CM

## 2021-07-25 DIAGNOSIS — R051 Acute cough: Secondary | ICD-10-CM

## 2021-07-25 LAB — POC INFLUENZA A AND B ANTIGEN (URGENT CARE ONLY)
INFLUENZA A ANTIGEN, POC: POSITIVE — AB
INFLUENZA B ANTIGEN, POC: NEGATIVE

## 2021-07-25 MED ORDER — PREDNISONE 10 MG (21) PO TBPK: ORAL_TABLET | Freq: Every day | ORAL | 0 refills | Status: DC

## 2021-07-25 MED ORDER — OSELTAMIVIR PHOSPHATE 75 MG PO CAPS: 75.0000 mg | ORAL_CAPSULE | Freq: Two times a day (BID) | ORAL | 0 refills | Status: DC

## 2021-07-25 MED ORDER — FLUTICASONE PROPIONATE 50 MCG/ACT NA SUSP: 1.0000 | Freq: Every day | NASAL | 2 refills | Status: DC

## 2021-07-25 NOTE — ED Provider Notes (Signed)
[facility]    CSN: [id number] Arrival date & time: 07/25/21  0844      History   Chief Complaint Chief Complaint  Patient presents with   Cough   Sore Throat   Generalized Body Aches    HPI Thomas Andrade is a 40 y.o. male.   Cough congestion, body aches, low grade fever and chills since last night. Has not taken anythig pta.    Past Medical History:  Diagnosis Date   Dysphagia    Rectal bleeding     Patient Active Problem List   Diagnosis Date Noted   Establishing care with new doctor, encounter for 05/25/2021   Environmental allergies 05/25/2021   Fatigue 05/25/2021    Past Surgical History:  Procedure Laterality Date   ESOPHAGEAL DILATION N/A 02/01/2015   Procedure: ESOPHAGEAL DILATION;  Surgeon: Malissa Hippo, MD;  [facility];  Service: Endoscopy;  Laterality: N/A;   ESOPHAGOGASTRODUODENOSCOPY N/A 02/01/2015   Procedure: ESOPHAGOGASTRODUODENOSCOPY (EGD);  Surgeon: Malissa Hippo, MD;  Location: [facility];  Service: Endoscopy;  Laterality: N/A;  1045   WISDOM TOOTH EXTRACTION         Home Medications    Prior to Admission medications   Medication Sig Start Date End Date Taking? Authorizing Provider  fluticasone (FLONASE) 50 MCG/ACT nasal spray Place 1 spray into both nostrils daily. 07/25/21  Yes Coralyn Mark, NP  oseltamivir (TAMIFLU) 75 MG capsule Take 1 capsule (75 mg total) by mouth every 12 (twelve) hours. 07/25/21  Yes Coralyn Mark, NP  predniSONE (STERAPRED UNI-PAK 21 TAB) 10 MG (21) TBPK tablet Take by mouth daily. Take 6 tabs by mouth daily  for 2 days, then 5 tabs for 2 days, then 4 tabs for 2 days, then 3 tabs for 2 days, 2 tabs for 2 days, then 1 tab by mouth daily for 2 days 07/25/21  Yes Coralyn Mark, NP    Family History Family History  Problem Relation Age of Onset   Cancer Maternal Aunt    Cancer Paternal Aunt     Social History Social History   Tobacco Use   Smoking status:  Never   Smokeless tobacco: Current    Types: Chew  Vaping Use   Vaping Use: Never used  Substance Use Topics   Alcohol use: Yes    Alcohol/week: 3.0 - 4.0 standard drinks    Types: 3 - 4 Cans of beer per week    Comment: occasionally   Drug use: No     Allergies   Penicillins and Shellfish allergy   Review of Systems Review of Systems  Constitutional:  Positive for activity change, chills, fatigue and fever.  HENT:  Positive for congestion, postnasal drip, rhinorrhea, sinus pressure, sinus pain, sneezing and sore throat.   Respiratory:  Positive for cough. Negative for shortness of breath.   Cardiovascular: Negative.   Gastrointestinal: Negative.   Genitourinary: Negative.   Musculoskeletal: Negative.   Neurological: Negative.     Physical Exam Triage Vital Signs ED Triage Vitals  Enc Vitals Group     BP 07/25/21 0951 130/81     Pulse Rate 07/25/21 0951 (!) 104     Resp 07/25/21 0951 17     Temp 07/25/21 0951 99.7 F (37.6 C)     Temp Source 07/25/21 0951 Oral     SpO2 07/25/21 0951 97 %     Weight --      Height --  Head Circumference --      Peak Flow --      Pain Score 07/25/21 0950 8     Pain Loc --      Pain Edu? --      Excl. in GC? --    No data found.  Updated Vital Signs BP 130/81 (BP Location: Right Arm)   Pulse (!) 104   Temp 99.7 F (37.6 C) (Oral)   Resp 17   SpO2 97%   Visual Acuity Right Eye Distance:   Left Eye Distance:   Bilateral Distance:    Right Eye Near:   Left Eye Near:    Bilateral Near:     Physical Exam Constitutional:      Appearance: He is well-developed. He is ill-appearing.  HENT:     Right Ear: Tympanic membrane normal.     Left Ear: Tympanic membrane normal.     Nose: Congestion present.     Mouth/Throat:     Mouth: Mucous membranes are moist.     Tonsils: No tonsillar exudate.  Eyes:     Conjunctiva/sclera: Conjunctivae normal.  Cardiovascular:     Rate and Rhythm: Tachycardia present.  Pulmonary:      Effort: Pulmonary effort is normal.  Abdominal:     Palpations: Abdomen is soft.  Musculoskeletal:     Cervical back: Normal range of motion.  Skin:    General: Skin is warm.  Neurological:     Mental Status: He is alert.     UC Treatments / Results  Labs (all labs ordered are listed, but only abnormal results are displayed) Labs Reviewed  POC INFLUENZA A AND B ANTIGEN (URGENT CARE ONLY) - Abnormal; Notable for the following components:      Result Value   INFLUENZA A ANTIGEN, POC POSITIVE (*)    All other components within normal limits    EKG   Radiology No results found.  Procedures Procedures (including critical care time)  Medications Ordered in UC Medications - No data to display  Initial Impression / Assessment and Plan / UC Course  I have reviewed the triage vital signs and the nursing notes.  Pertinent labs & imaging results that were available during my care of the patient were reviewed by me and considered in my medical decision making (see chart for details).     Take motrin or tylenol and needed for pain or fever  Stay hydrated well  If not better in48 hours go to  er  Take tylenol or motrin as needed for pain    Your flu test was positive  Stay hydrated well  Final Clinical Impressions(s) / UC Diagnoses   Final diagnoses:  Acute cough  Fever, unspecified  Influenza due to identified novel influenza A virus with other respiratory manifestations     Discharge Instructions      Take motrin or tylenol and needed for pain or fever  Stay hydrated well  If not better in48 hours go to  er  Take tylenol or motrin as needed for pain    Your flu test was positive  Stay hydrated well      ED Prescriptions     Medication Sig Dispense Auth. Provider   predniSONE (STERAPRED UNI-PAK 21 TAB) 10 MG (21) TBPK tablet Take by mouth daily. Take 6 tabs by mouth daily  for 2 days, then 5 tabs for 2 days, then 4 tabs for 2 days, then 3 tabs for 2  days, 2 tabs for 2  days, then 1 tab by mouth daily for 2 days 42 tablet Maple Mirza L, NP   fluticasone (FLONASE) 50 MCG/ACT nasal spray Place 1 spray into both nostrils daily. 16 g Coralyn Mark, NP   oseltamivir (TAMIFLU) 75 MG capsule Take 1 capsule (75 mg total) by mouth every 12 (twelve) hours. 10 capsule Coralyn Mark, NP      PDMP not reviewed this encounter.   Coralyn Mark, NP 07/25/21 1024

## 2021-07-25 NOTE — ED Triage Notes (Signed)
Since last night having body aches, sore throat, cough, chills and hot spells.

## 2021-07-25 NOTE — Discharge Instructions (Addendum)
Take motrin or tylenol and needed for pain or fever  Stay hydrated well  If not better in48 hours go to  er  Take tylenol or motrin as needed for pain    Your flu test was positive  Stay hydrated well

## 2021-09-07 ENCOUNTER — Encounter: Payer: BC Managed Care – PPO | Admitting: Nurse Practitioner

## 2021-09-23 ENCOUNTER — Encounter: Payer: BC Managed Care – PPO | Admitting: Nurse Practitioner

## 2022-02-21 ENCOUNTER — Ambulatory Visit: Payer: BC Managed Care – PPO | Admitting: Nurse Practitioner

## 2022-07-08 ENCOUNTER — Encounter (INDEPENDENT_AMBULATORY_CARE_PROVIDER_SITE_OTHER): Payer: Self-pay | Admitting: Gastroenterology

## 2023-05-15 ENCOUNTER — Encounter (HOSPITAL_COMMUNITY): Payer: Self-pay

## 2023-05-15 ENCOUNTER — Other Ambulatory Visit: Payer: Self-pay

## 2023-05-15 ENCOUNTER — Emergency Department (HOSPITAL_COMMUNITY)
Admission: EM | Admit: 2023-05-15 | Discharge: 2023-05-15 | Disposition: A | Discharge status: Home / Self Care | Payer: BC Managed Care – PPO | Attending: Emergency Medicine | Admitting: Emergency Medicine

## 2023-05-15 ENCOUNTER — Emergency Department (HOSPITAL_COMMUNITY): Payer: BC Managed Care – PPO

## 2023-05-15 DIAGNOSIS — Y990 Civilian activity done for income or pay: Secondary | ICD-10-CM | POA: Insufficient documentation

## 2023-05-15 DIAGNOSIS — S39012A Strain of muscle, fascia and tendon of lower back, initial encounter: Secondary | ICD-10-CM | POA: Diagnosis not present

## 2023-05-15 DIAGNOSIS — X500XXA Overexertion from strenuous movement or load, initial encounter: Secondary | ICD-10-CM | POA: Insufficient documentation

## 2023-05-15 DIAGNOSIS — T148XXA Other injury of unspecified body region, initial encounter: Secondary | ICD-10-CM

## 2023-05-15 DIAGNOSIS — M545 Low back pain, unspecified: Secondary | ICD-10-CM | POA: Diagnosis present

## 2023-05-15 HISTORY — DX: Other specified abnormal findings of blood chemistry: R79.89

## 2023-05-15 MED ORDER — PREDNISONE 10 MG PO TABS: 20.0000 mg | ORAL_TABLET | Freq: Every day | ORAL | 0 refills | Status: DC

## 2023-05-15 MED ORDER — CYCLOBENZAPRINE HCL 10 MG PO TABS: 10.0000 mg | ORAL_TABLET | Freq: Three times a day (TID) | ORAL | 0 refills | Status: DC | PRN

## 2023-05-15 MED ORDER — HYDROMORPHONE HCL 1 MG/ML IJ SOLN
1.0000 mg | Freq: Once | INTRAMUSCULAR | Status: AC
  Administered 2023-05-15: 1 mg via INTRAMUSCULAR
  Filled 2023-05-15: qty 1

## 2023-05-15 MED ORDER — OXYCODONE-ACETAMINOPHEN 5-325 MG PO TABS: 1.0000 | ORAL_TABLET | Freq: Four times a day (QID) | ORAL | 0 refills | Status: DC | PRN

## 2023-05-15 NOTE — ED Provider Notes (Signed)
[place] EMERGENCY DEPARTMENT AT [facility] Provider Note   CSN: [id number] Arrival date & time: 05/15/23  7253     History {Add pertinent medical, surgical, social history, OB history to HPI:1} Chief Complaint  Patient presents with   Back Pain    Austin Broadwater is a 42 y.o. male.  Patient states that his lower back is hurting.  He does a lot of heavy lifting at work.   Back Pain      Home Medications Prior to Admission medications   Medication Sig Start Date End Date Taking? Authorizing Provider  cyclobenzaprine (FLEXERIL) 10 MG tablet Take 1 tablet (10 mg total) by mouth 3 (three) times daily as needed for muscle spasms. 05/15/23  Yes Bethann Berkshire, MD  oxyCODONE-acetaminophen (PERCOCET/ROXICET) 5-325 MG tablet Take 1 tablet by mouth every 6 (six) hours as needed for severe pain. 05/15/23  Yes Bethann Berkshire, MD  predniSONE (DELTASONE) 10 MG tablet Take 2 tablets (20 mg total) by mouth daily. 05/15/23  Yes Bethann Berkshire, MD  fluticasone (FLONASE) 50 MCG/ACT nasal spray Place 1 spray into both nostrils daily. 07/25/21   Coralyn Mark, NP  oseltamivir (TAMIFLU) 75 MG capsule Take 1 capsule (75 mg total) by mouth every 12 (twelve) hours. 07/25/21   Coralyn Mark, NP      Allergies    Penicillins and Shellfish allergy    Review of Systems   Review of Systems  Musculoskeletal:  Positive for back pain.    Physical Exam Updated Vital Signs BP 107/65   Pulse 79   Temp 98.6 F (37 C) (Oral)   Resp 20   Ht 5\' 11"  (1.803 m)   Wt 113.4 kg   SpO2 94%   BMI 34.87 kg/m  Physical Exam  ED Results / Procedures / Treatments   Labs (all labs ordered are listed, but only abnormal results are displayed) Labs Reviewed - No data to display  EKG None  Radiology DG Lumbar Spine Complete  Result Date: 05/15/2023 CLINICAL DATA:  Low back pain after lifting furniture on Sunday. EXAM: LUMBAR SPINE - COMPLETE 4+ VIEW COMPARISON:  None Available. FINDINGS:  Five non-rib-bearing lumbar vertebra. Mild broad-based rightward curvature. No listhesis. No acute fracture. Normal vertebral body heights. Minor anterior spurring at multiple levels with preservation of disc spaces. No evidence of pars defects or focal bone abnormality. The sacroiliac joints are congruent. IMPRESSION: 1. No acute fracture of the lumbar spine. 2. Mild broad-based rightward curvature may be positional, scoliosis, or muscle spasm. 3. Minor spondylosis. Electronically Signed   By: Narda Rutherford M.D.   On: 05/15/2023 12:41    Procedures Procedures  {Document cardiac monitor, telemetry assessment procedure when appropriate:1}  Medications Ordered in ED Medications  HYDROmorphone (DILAUDID) injection 1 mg (1 mg Intramuscular Given 05/15/23 1114)    ED Course/ Medical Decision Making/ A&P   {   Click here for ABCD2, HEART and other calculatorsREFRESH Note before signing :1}                              Medical Decision Making Amount and/or Complexity of Data Reviewed Radiology: ordered.  Risk Prescription drug management.   Patient with a lumbar strain.  Patient is started on prednisone and Flexeril and Percocet and will follow-up with Ortho.  He will rest at home for 2 days and do no lifting over 10 pounds for a week  {Document critical care time when appropriate:1} {  Document review of labs and clinical decision tools ie heart score, Chads2Vasc2 etc:1}  {Document your independent review of radiology images, and any outside records:1} {Document your discussion with family members, caretakers, and with consultants:1} {Document social determinants of health affecting pt's care:1} {Document your decision making why or why not admission, treatments were needed:1} Final Clinical Impression(s) / ED Diagnoses Final diagnoses:  Muscle strain  Strain of lumbar region, initial encounter    Rx / DC Orders ED Discharge Orders          Ordered    predniSONE (DELTASONE) 10 MG  tablet  Daily        05/15/23 1455    cyclobenzaprine (FLEXERIL) 10 MG tablet  3 times daily PRN        05/15/23 1455    oxyCODONE-acetaminophen (PERCOCET/ROXICET) 5-325 MG tablet  Every 6 hours PRN        05/15/23 1455

## 2023-05-15 NOTE — Discharge Instructions (Addendum)
Rest at home for 2 days.  Follow-up with Dr. Romeo Apple next week for recheck if not improving

## 2023-05-15 NOTE — ED Triage Notes (Signed)
Pt presents to ED with complaints of lower back pain after lifting furniture on Sunday. Pt describes pain as stabbing.

## 2023-05-24 ENCOUNTER — Encounter: Payer: Self-pay | Admitting: Orthopedic Surgery

## 2023-05-24 ENCOUNTER — Ambulatory Visit: Payer: BC Managed Care – PPO | Admitting: Orthopedic Surgery

## 2023-05-24 VITALS — BP 136/82 | HR 76 | Ht 71.0 in | Wt 252.8 lb

## 2023-05-24 DIAGNOSIS — M545 Low back pain, unspecified: Secondary | ICD-10-CM | POA: Diagnosis not present

## 2023-05-24 MED ORDER — METHOCARBAMOL 500 MG PO TABS: 500.0000 mg | ORAL_TABLET | Freq: Three times a day (TID) | ORAL | 1 refills | Status: AC

## 2023-05-24 MED ORDER — PREDNISONE 10 MG (48) PO TBPK: ORAL_TABLET | Freq: Every day | ORAL | 0 refills | Status: DC

## 2023-05-24 NOTE — Patient Instructions (Signed)
Physical therapy has been ordered for you at [facility]. They should call you to schedule, [phone] is the phone number to call, if you want to call to schedule.

## 2023-05-24 NOTE — Progress Notes (Signed)
Office Visit Note   Patient: Thomas Andrade           Date of Birth: Jan 24, 1981           MRN: [id number] Visit Date: 05/24/2023 Requested by: Anabel Halon, MD [address] [place],  [place] [address] PCP: No primary care provider on file.   Assessment & Plan:   Encounter Diagnoses  Name Primary?   Lumbar pain Yes   Acute midline low back pain without sciatica     Meds ordered this encounter  Medications   methocarbamol (ROBAXIN) 500 MG tablet    Sig: Take 1 tablet (500 mg total) by mouth 3 (three) times daily.    Dispense:  60 tablet    Refill:  1   predniSONE (STERAPRED UNI-PAK 48 TAB) 10 MG (48) TBPK tablet    Sig: Take by mouth daily.    Dispense:  48 tablet    Refill:  74    42 year old male longstanding difficulties with his back.  No prior physical therapy.  Recommend dose of prednisone switch from Flexeril to Robaxin a physical therapy program to include home exercises and follow-up in 6 weeks.  MRI was not done today as he was not having any findings that would suggest surgery is needed   Subjective: Chief Complaint  Patient presents with   Back Pain    LBP for yrs but recently picked up furniture 05/12/23 and had a catch in back and he couldn't move for awhile. He did go to the ER and had xrays done.     HPI: 42 year old male has had back pain since he was in his 27s or maybe slightly earlier he has intermittent times where his back goes out on him.  He describes this as a sharp knifelike pain in the center of his back.  This most recent episode came from lifting a heavy object.  He denies red flags he denies numbness or tingling in his legs he has some pain in the lower back which goes more left side than right and into the buttocks with some tingly sensations there but nothing in his hips or legs or groin area.  He has never had physical therapy  He did not take the Percocet or Deltasone he did take the Flexeril and it may have very sleepy                 ROS: Again no red flags   Images personally read and my interpretation : Plain films done at the hospital shows some truncal asymmetry some mild degenerative changes with anterior spurring but preserved disc spaces no evidence of spondylolysis or listhesis  Visit Diagnoses:  1. Lumbar pain   2. Acute midline low back pain without sciatica      Follow-Up Instructions: Return in about 6 weeks (around 07/05/2023) for back pain .    Objective: Vital Signs: BP 136/82   Pulse 76   Ht 5\' 11"  (1.803 m)   Wt 252 lb 12.8 oz (114.7 kg)   BMI 35.26 kg/m   Physical Exam Vitals and nursing note reviewed.  Constitutional:      Appearance: Normal appearance.  HENT:     Head: Normocephalic and atraumatic.  Eyes:     General: No scleral icterus.       Right eye: No discharge.        Left eye: No discharge.     Extraocular Movements: Extraocular movements intact.     Conjunctiva/sclera: Conjunctivae  normal.     Pupils: Pupils are equal, round, and reactive to light.  Cardiovascular:     Rate and Rhythm: Normal rate.     Pulses: Normal pulses.  Skin:    General: Skin is warm and dry.     Capillary Refill: Capillary refill takes less than 2 seconds.  Neurological:     General: No focal deficit present.     Mental Status: He is alert and oriented to person, place, and time.  Psychiatric:        Mood and Affect: Mood normal.        Behavior: Behavior normal.        Thought Content: Thought content normal.        Judgment: Judgment normal.      Ortho Exam His lower extremities exhibited normal strength, reflexes normal, sensory exam normal, hip range of motion normal, his only findings were really in the lower back with discomfort there  Specialty Comments:  No specialty comments available.  Imaging: No results found.   PMFS History: Patient Active Problem List   Diagnosis Date Noted   Establishing care with new doctor, encounter for 05/25/2021   Environmental allergies  05/25/2021   Fatigue 05/25/2021   Past Medical History:  Diagnosis Date   Dysphagia    Low testosterone    Rectal bleeding     Family History  Problem Relation Age of Onset   Cancer Maternal Aunt    Cancer Paternal Aunt     Past Surgical History:  Procedure Laterality Date   ESOPHAGEAL DILATION N/A 02/01/2015   Procedure: ESOPHAGEAL DILATION;  Surgeon: Malissa Hippo, MD;  [facility];  Service: Endoscopy;  Laterality: N/A;   ESOPHAGOGASTRODUODENOSCOPY N/A 02/01/2015   Procedure: ESOPHAGOGASTRODUODENOSCOPY (EGD);  Surgeon: Malissa Hippo, MD;  [facility];  Service: Endoscopy;  Laterality: N/A;  1045   WISDOM TOOTH EXTRACTION     Social History   Occupational History   Occupation:  [employer] in [place]  Tobacco Use   Smoking status: Never   Smokeless tobacco: Former    Types: Engineer, drilling   Vaping status: Never Used  Substance and Sexual Activity   Alcohol use: Yes    Alcohol/week: 3.0 - 4.0 standard drinks of alcohol    Types: 3 - 4 Cans of beer per week    Comment: occasionally   Drug use: No   Sexual activity: Yes

## 2023-05-28 ENCOUNTER — Ambulatory Visit: Payer: BC Managed Care – PPO | Admitting: Internal Medicine

## 2023-06-06 ENCOUNTER — Encounter: Payer: Self-pay | Admitting: Orthopedic Surgery

## 2023-06-06 ENCOUNTER — Ambulatory Visit: Payer: BC Managed Care – PPO | Admitting: Orthopedic Surgery

## 2023-06-06 VITALS — BP 136/82 | Ht 71.0 in | Wt 252.0 lb

## 2023-06-06 DIAGNOSIS — M545 Low back pain, unspecified: Secondary | ICD-10-CM | POA: Diagnosis not present

## 2023-06-06 NOTE — Patient Instructions (Addendum)
Physical therapy has been ordered for you at [facility] They should call you to schedule, [phone]  is the phone number to call, if you want to call to schedule.   While we are working on your approval for MRI please go ahead and call to schedule your appointment with [facility]aging within at least one (1) week.   [phone]

## 2023-06-06 NOTE — Addendum Note (Signed)
Addended byCaffie Damme on: 06/06/2023 09:59 AM   Modules accepted: Orders

## 2023-06-06 NOTE — Progress Notes (Signed)
Follow-up visit   Chief Complaint  Patient presents with   Back Pain    Worse since Monday needs work note / has spasms no new injury, starts physical therapy on 06/21/23   42 year old male seen on September 26 for acute lower back pain after lifting a heavy object presents back 13 days later with worsening pain increased back spasms currently on Sterapred and Robaxin  On his last visit on September 62:  42 year old male longstanding difficulties with his back.  No prior physical therapy.  Recommend dose of prednisone switch from Flexeril to Robaxin a physical therapy program to include home exercises and follow-up in 6 weeks.  MRI was not done today as he was not having any findings that would suggest surgery is needed  Again normal strength in both lower extremities however, when he sneezes or coughs which is a de facto Valsalva maneuver his pain increases and he gets sharp electric sensations down his back and up into his lumbar and thoracic spine  He cannot stand up straight.  Recommend MRI to rule out central disc herniation  Continue muscle relaxers and prednisone.  Change therapy from [facility] to benchmark  Bowel and bladder function he notes constipation otherwise normal.

## 2023-06-11 ENCOUNTER — Telehealth: Payer: BC Managed Care – PPO | Admitting: Orthopedic Surgery

## 2023-06-11 NOTE — Telephone Encounter (Signed)
MRI denied, he needs physical therapy first. I will call him.

## 2023-06-12 NOTE — Addendum Note (Signed)
Addended byCaffie Damme on: 06/12/2023 11:45 AM   Modules accepted: Orders

## 2023-06-12 NOTE — Telephone Encounter (Signed)
I called him to advise He will also get denial in mail His mailbox is full

## 2023-06-15 ENCOUNTER — Ambulatory Visit: Payer: BC Managed Care – PPO | Admitting: Family Medicine

## 2023-06-15 VITALS — BP 120/78 | HR 92 | Temp 98.1°F | Ht 71.0 in | Wt 246.8 lb

## 2023-06-15 DIAGNOSIS — M545 Low back pain, unspecified: Secondary | ICD-10-CM | POA: Insufficient documentation

## 2023-06-15 DIAGNOSIS — E785 Hyperlipidemia, unspecified: Secondary | ICD-10-CM | POA: Diagnosis not present

## 2023-06-15 DIAGNOSIS — Z79899 Other long term (current) drug therapy: Secondary | ICD-10-CM

## 2023-06-15 DIAGNOSIS — E291 Testicular hypofunction: Secondary | ICD-10-CM | POA: Diagnosis not present

## 2023-06-15 NOTE — Assessment & Plan Note (Signed)
Review of previous labs reveals hyperlipidemia.  Reassessing today with lipid panel.

## 2023-06-15 NOTE — Progress Notes (Signed)
Subjective:  Patient ID: Thomas Andrade, male    DOB: Mar 10, 1981  Age: 42 y.o. MRN: [id number]  CC:  Establish care  HPI:  42 year old presents to establish care.  Patient currently having ongoing low back pain.  He is being followed by orthopedics.  Using muscle relaxer as needed and going to physical therapy.  Improving.  He has hypogonadism.  He is on testosterone therapy.  Managed via [employer] in [place].  Denies chest pain or shortness of breath.  He states that overall he is feeling well.  Declines flu shot today.  Patient Active Problem List   Diagnosis Date Noted   Hypogonadism in male 06/15/2023   Hyperlipidemia 06/15/2023   Low back pain 06/15/2023    Social Hx   Social History   Socioeconomic History   Marital status: Married    Spouse name: Not on file   Number of children: 0   Years of education: Not on file   Highest education level: Some college, no degree  Occupational History   Occupation: [employer] in [place]  Tobacco Use   Smoking status: Never   Smokeless tobacco: Former    Types: Engineer, drilling   Vaping status: Never Used  Substance and Sexual Activity   Alcohol use: Yes    Alcohol/week: 3.0 - 4.0 standard drinks of alcohol    Types: 3 - 4 Cans of beer per week    Comment: occasionally   Drug use: No   Sexual activity: Yes  Other Topics Concern   Not on file  Social History Narrative   Not on file   Social Determinants of Health   Financial Resource Strain: Low Risk  (06/15/2023)   Overall Financial Resource Strain (CARDIA)    Difficulty of Paying Living Expenses: Not very hard  Food Insecurity: No Food Insecurity (06/15/2023)   Hunger Vital Sign    Worried About Running Out of Food in the Last Year: Never true    Ran Out of Food in the Last Year: Never true  Transportation Needs: No Transportation Needs (06/15/2023)   PRAPARE - Administrator, Civil Service (Medical): No    Lack of Transportation  (Non-Medical): No  Physical Activity: Sufficiently Active (06/15/2023)   Exercise Vital Sign    Days of Exercise per Week: 5 days    Minutes of Exercise per Session: 30 min  Stress: No Stress Concern Present (06/15/2023)   [employer] of Occupational Health - Occupational Stress Questionnaire    Feeling of Stress : Not at all  Social Connections: Moderately Isolated (06/15/2023)   Social Connection and Isolation Panel [NHANES]    Frequency of Communication with Friends and Family: More than three times a week    Frequency of Social Gatherings with Friends and Family: More than three times a week    Attends Religious Services: Never    Diplomatic Services operational officer: No    Attends Engineer, structural: Not on file    Marital Status: Married    Review of Systems Per HPI  Objective:  BP 120/78   Pulse 92   Temp 98.1 F (36.7 C)   Ht 5\' 11"  (1.803 m)   Wt 246 lb 12.8 oz (111.9 kg)   SpO2 100%   BMI 34.42 kg/m      06/15/2023    8:24 AM 06/06/2023    9:30 AM 05/24/2023    9:43 AM  BP/Weight  Systolic BP 120 136  136  Diastolic BP 78 82 82  Wt. (Lbs) 246.8 252 252.8  BMI 34.42 kg/m2 35.15 kg/m2 35.26 kg/m2    Physical Exam Vitals and nursing note reviewed.  Constitutional:      General: He is not in acute distress.    Appearance: Normal appearance.  HENT:     Head: Normocephalic and atraumatic.  Eyes:     General:        Right eye: No discharge.        Left eye: No discharge.     Conjunctiva/sclera: Conjunctivae normal.  Cardiovascular:     Rate and Rhythm: Normal rate and regular rhythm.  Pulmonary:     Effort: Pulmonary effort is normal.     Breath sounds: Normal breath sounds. No wheezing, rhonchi or rales.  Neurological:     Mental Status: He is alert.  Psychiatric:        Mood and Affect: Mood normal.        Behavior: Behavior normal.     Lab Results  Component Value Date   WBC 6.7 05/30/2021   HGB 14.9 05/30/2021   HCT 43.3  05/30/2021   PLT 261 05/30/2021   GLUCOSE 79 05/30/2021   CHOL 204 (H) 05/30/2021   TRIG 122 05/30/2021   HDL 55 05/30/2021   LDLCALC 127 (H) 05/30/2021   ALT 22 05/30/2021   AST 21 05/30/2021   NA 143 05/30/2021   K 4.2 05/30/2021   CL 105 05/30/2021   CREATININE 1.01 05/30/2021   BUN 13 05/30/2021   CO2 25 05/30/2021   TSH 2.060 05/30/2021     Assessment & Plan:   Problem List Items Addressed This Visit       Endocrine   Hypogonadism in male - Primary    Obtaining labs to assess today.      Relevant Orders   Testosterone     Other   Hyperlipidemia    Review of previous labs reveals hyperlipidemia.  Reassessing today with lipid panel.      Relevant Orders   Lipid panel   Low back pain    Improving with physical therapy.  Continue.      Other Visit Diagnoses     High risk medication use       Relevant Orders   CBC   CMP14+EGFR   PSA       Follow-up:  Annually  Everlene Other DO [facility]

## 2023-06-15 NOTE — Assessment & Plan Note (Signed)
Improving with physical therapy.  Continue.

## 2023-06-15 NOTE — Patient Instructions (Signed)
Labs this am.  Follow up annually.  Take care  Dr. Adriana Simas

## 2023-06-15 NOTE — Assessment & Plan Note (Signed)
Obtaining labs to assess today.

## 2023-06-19 ENCOUNTER — Other Ambulatory Visit: Payer: BC Managed Care – PPO

## 2023-06-20 ENCOUNTER — Ambulatory Visit (HOSPITAL_COMMUNITY): Payer: BC Managed Care – PPO

## 2023-07-05 ENCOUNTER — Ambulatory Visit: Payer: BC Managed Care – PPO | Admitting: Orthopedic Surgery

## 2023-07-05 ENCOUNTER — Encounter: Payer: Self-pay | Admitting: Orthopedic Surgery

## 2023-07-05 ENCOUNTER — Telehealth: Payer: Self-pay | Admitting: Radiology

## 2023-07-05 VITALS — BP 144/89 | HR 84 | Ht 71.0 in | Wt 258.0 lb

## 2023-07-05 DIAGNOSIS — M545 Low back pain, unspecified: Secondary | ICD-10-CM

## 2023-07-05 NOTE — Telephone Encounter (Signed)
I spoke to Dr Rexene Edison about his MRI report He DOES want to see patient in the office to review  I called him he will schedule the MRI for after he is on his wifes insurance then call here to let us know insurance information  He will make follow up appointment to review.

## 2023-07-05 NOTE — Patient Instructions (Addendum)
While we are working on your approval for MRI please go ahead and call to schedule your appointment with [facility] within at least one (1) week.  ° [phone]  ° °

## 2023-07-05 NOTE — Progress Notes (Signed)
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   normal strength in both lower extremities however, when he sneezes or coughs which is a de facto Valsalva maneuver his pain increases and he gets sharp electric sensations down his back and up into his lumbar and thoracic spine   He cannot stand up straight.

## 2023-09-05 ENCOUNTER — Telehealth: Payer: Self-pay | Admitting: Orthopedic Surgery

## 2023-09-05 NOTE — Telephone Encounter (Signed)
 I spoke to patient need to get his MRI authorized He has new Acupuncturist and a secondary through his wife Will you please let me know when he comes by with the insurance information so I can get his MRI approved?

## 2023-09-12 ENCOUNTER — Ambulatory Visit (HOSPITAL_COMMUNITY)
Admission: RE | Admit: 2023-09-12 | Discharge: 2023-09-12 | Disposition: A | Discharge status: Home / Self Care | Payer: 59 | Source: Ambulatory Visit | Attending: Orthopedic Surgery | Admitting: Orthopedic Surgery

## 2023-09-12 ENCOUNTER — Other Ambulatory Visit: Payer: BC Managed Care – PPO

## 2023-09-12 DIAGNOSIS — M545 Low back pain, unspecified: Secondary | ICD-10-CM | POA: Insufficient documentation

## 2024-06-11 ENCOUNTER — Encounter (INDEPENDENT_AMBULATORY_CARE_PROVIDER_SITE_OTHER): Payer: Self-pay | Admitting: Gastroenterology

## 2024-08-01 ENCOUNTER — Ambulatory Visit: Admitting: Orthopedic Surgery

## 2024-08-01 ENCOUNTER — Encounter: Payer: Self-pay | Admitting: Orthopedic Surgery

## 2024-08-01 ENCOUNTER — Other Ambulatory Visit (INDEPENDENT_AMBULATORY_CARE_PROVIDER_SITE_OTHER): Payer: Self-pay

## 2024-08-01 DIAGNOSIS — M25512 Pain in left shoulder: Secondary | ICD-10-CM | POA: Diagnosis not present

## 2024-08-01 NOTE — Progress Notes (Signed)
 Office Visit Note   Patient: Thomas Andrade           Date of Birth: 12-08-1980           MRN: [id number] Visit Date: 08/01/2024 Requested by: Cook, Jayce G, DO [address] [place],  [place] [address] PCP: Bluford Jacqulyn MATSU, DO  Subjective: Chief Complaint  Patient presents with   Left Shoulder - Pain    HPI: Thomas Andrade is a 43 y.o. male who presents to the office reporting left shoulder pain which has been severe over the past 2 months.  Patient describes a posterior dislocation with rotator cuff injury in 2003.  He was in [employer] school at the time.  2 gave him a 50-50 chance of improvement with surgery.  He opted for no surgery.  Currently he describes popping grinding catching as well as a radicular pain in the shoulder with numbness and tingling extending into the hand.  He is right-hand dominant.  Does not report any neck pain.  The pain does wake him from sleep at night.  Also describes scapular pain.  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  Does not take much in the way of medication for pain.  Currently working for the [employer] which involves a lot of driving and outdoor physical work.  Does get some relief of symptoms with his arm overhead or behind his back..                ROS: All systems reviewed are negative as they relate to the chief complaint within the history of present illness.  Patient denies fevers or chills.  Assessment & Plan: Visit Diagnoses:  1. Left shoulder pain, unspecified chronicity     Plan: Impression is shoulder versus arm pain.  He may have a structural problem in the shoulder with the labrum or small rotator cuff tear.  Symptoms are severe.  If shoulder workup is negative we would proceed with MRI of the cervical spine.  For now based on his history and some parts of his exam I think it is most likely he has some shoulder pathology.  Follow-up after the scan.  Follow-Up  Instructions: No follow-ups on file.   Orders:  Orders Placed This Encounter  Procedures   XR Shoulder Left   Arthrogram   MR Shoulder Left w/ contrast   No orders of the defined types were placed in this encounter.     Procedures: No procedures performed   Clinical Data: No additional findings.  Objective: Vital Signs: There were no vitals taken for this visit.  Physical Exam:  Constitutional: Patient appears well-developed HEENT:  Head: Normocephalic Eyes:EOM are normal Neck: Normal range of motion Cardiovascular: Normal rate Pulmonary/chest: Effort normal Neurologic: Patient is alert Skin: Skin is warm Psychiatric: Patient has normal mood and affect  Ortho Exam: Ortho exam demonstrates bilateral shoulder range of motion of 60/105/165.  Negative apprehension relocation testing for anterior and posterior pathology.  Equivocal O'Brien's testing.  No masses lymphadenopathy or skin changes noted in the shoulder region.  Does have a little bit of crepitus in the left shoulder lateral acromial region with abduction which is not present on the right-hand side.  No discrete AC joint tenderness. Patient has bilateral 5 out of 5 grip EPL FPL interosseous wrist flexion wrist extension bicep triceps and deltoid strength.  Bilateral palpable radial pulses and no paresthesias C5-T1 in either arm.  Neck range of motion flexion chin to chest with extension approximately 50 degrees with approximately 50 degrees of rotation bilaterally.  No masses lymphadenopathy or skin changes around the neck or shoulder girdle region bilaterally   Specialty Comments:  No specialty comments available.  Imaging: XR Shoulder Left Result Date: 08/01/2024 [facility] outlet axillary lateral radiographs left shoulder reviewed.  No acute fracture.  Shoulder is located.  Acromiohumeral distance is normal.  No significant degenerative changes in the glenohumeral or AC joint.  Visualized lung fields clear.     PMFS  History: Patient Active Problem List   Diagnosis Date Noted   Hypogonadism in male 06/15/2023   Hyperlipidemia 06/15/2023   Low back pain 06/15/2023   Past Medical History:  Diagnosis Date   Dysphagia    Low testosterone     Rectal bleeding     Family History  Problem Relation Age of Onset   Cancer Maternal Aunt    Cancer Paternal Aunt     Past Surgical History:  Procedure Laterality Date   ESOPHAGEAL DILATION N/A 02/01/2015   Procedure: ESOPHAGEAL DILATION;  Surgeon: Claudis RAYMOND Rivet, MD;  [facility];  Service: Endoscopy;  Laterality: N/A;   ESOPHAGOGASTRODUODENOSCOPY N/A 02/01/2015   Procedure: ESOPHAGOGASTRODUODENOSCOPY (EGD);  Surgeon: Claudis RAYMOND Rivet, MD;  Location: [facility];  Service: Endoscopy;  Laterality: N/A;  1045   WISDOM TOOTH EXTRACTION     Social History   Occupational History   Occupation:  [employer] in [place]  Tobacco Use   Smoking status: Never   Smokeless tobacco: Former    Types: Engineer, Drilling   Vaping status: Never Used  Substance and Sexual Activity   Alcohol use: Yes    Alcohol/week: 3.0 - 4.0 standard drinks of alcohol    Types: 3 - 4 Cans of beer per week    Comment: occasionally   Drug use: No   Sexual activity: Yes

## 2024-08-12 ENCOUNTER — Telehealth: Payer: Self-pay | Admitting: Orthopedic Surgery

## 2024-08-12 NOTE — Telephone Encounter (Signed)
 Pt's wife called asking if referral for MRI can be moved to different facility due to price different. She is asking for MRI referral be sent to [facility]. Please call pt's wife Joy at [phone].

## 2024-08-12 NOTE — Telephone Encounter (Signed)
 Order faxed to [facility] at [phone]

## 2024-08-18 ENCOUNTER — Other Ambulatory Visit

## 2024-09-21 ENCOUNTER — Encounter (HOSPITAL_COMMUNITY): Payer: Self-pay

## 2024-09-21 ENCOUNTER — Emergency Department (HOSPITAL_COMMUNITY)
Admission: EM | Admit: 2024-09-21 | Discharge: 2024-09-21 | Disposition: A | Discharge status: Home / Self Care | Attending: Emergency Medicine | Admitting: Emergency Medicine

## 2024-09-21 ENCOUNTER — Emergency Department (HOSPITAL_COMMUNITY)

## 2024-09-21 ENCOUNTER — Other Ambulatory Visit: Payer: Self-pay

## 2024-09-21 DIAGNOSIS — R072 Precordial pain: Secondary | ICD-10-CM | POA: Diagnosis present

## 2024-09-21 DIAGNOSIS — R002 Palpitations: Secondary | ICD-10-CM | POA: Diagnosis not present

## 2024-09-21 LAB — CBC WITH DIFFERENTIAL/PLATELET
Abs Immature Granulocytes: 0.01 10*3/uL (ref 0.00–0.07)
Basophils Absolute: 0.1 10*3/uL (ref 0.0–0.1)
Basophils Relative: 1 %
Eosinophils Absolute: 0.3 10*3/uL (ref 0.0–0.5)
Eosinophils Relative: 4 %
HCT: 45.7 % (ref 39.0–52.0)
Hemoglobin: 16.1 g/dL (ref 13.0–17.0)
Immature Granulocytes: 0 %
Lymphocytes Relative: 32 %
Lymphs Abs: 2.2 10*3/uL (ref 0.7–4.0)
MCH: 32.9 pg (ref 26.0–34.0)
MCHC: 35.2 g/dL (ref 30.0–36.0)
MCV: 93.3 fL (ref 80.0–100.0)
Monocytes Absolute: 0.5 10*3/uL (ref 0.1–1.0)
Monocytes Relative: 8 %
Neutro Abs: 3.9 10*3/uL (ref 1.7–7.7)
Neutrophils Relative %: 55 %
Platelets: 224 10*3/uL (ref 150–400)
RBC: 4.9 MIL/uL (ref 4.22–5.81)
RDW: 13.1 % (ref 11.5–15.5)
WBC: 7 10*3/uL (ref 4.0–10.5)
nRBC: 0 % (ref 0.0–0.2)

## 2024-09-21 LAB — COMPREHENSIVE METABOLIC PANEL WITH GFR
ALT: 25 U/L (ref 0–44)
AST: 31 U/L (ref 15–41)
Albumin: 4.5 g/dL (ref 3.5–5.0)
Alkaline Phosphatase: 49 U/L (ref 38–126)
Anion gap: 12 (ref 5–15)
BUN: 14 mg/dL (ref 6–20)
CO2: 24 mmol/L (ref 22–32)
Calcium: 8.7 mg/dL — ABNORMAL LOW (ref 8.9–10.3)
Chloride: 105 mmol/L (ref 98–111)
Creatinine, Ser: 1.15 mg/dL (ref 0.61–1.24)
GFR, Estimated: >60 mL/min
Glucose, Bld: 93 mg/dL (ref 70–99)
Potassium: 3.9 mmol/L (ref 3.5–5.1)
Sodium: 141 mmol/L (ref 135–145)
Total Bilirubin: 0.7 mg/dL (ref 0.0–1.2)
Total Protein: 7.1 g/dL (ref 6.5–8.1)

## 2024-09-21 LAB — TROPONIN T, HIGH SENSITIVITY
Troponin T High Sensitivity: 12 ng/L (ref 0–19)
Troponin T High Sensitivity: 12 ng/L (ref 0–19)

## 2024-09-21 MED ORDER — ACETAMINOPHEN 500 MG PO TABS
1000.0000 mg | ORAL_TABLET | Freq: Once | ORAL | Status: AC
  Administered 2024-09-21: 1000 mg via ORAL
  Filled 2024-09-21: qty 2

## 2024-09-21 MED ORDER — ALUM & MAG HYDROXIDE-SIMETH 200-200-20 MG/5ML PO SUSP
30.0000 mL | Freq: Once | ORAL | Status: AC
  Administered 2024-09-21: 30 mL via ORAL
  Filled 2024-09-21: qty 30

## 2024-09-21 MED ORDER — FAMOTIDINE 20 MG PO TABS
20.0000 mg | ORAL_TABLET | Freq: Once | ORAL | Status: AC
  Administered 2024-09-21: 20 mg via ORAL
  Filled 2024-09-21: qty 1

## 2024-09-21 NOTE — ED Triage Notes (Signed)
 Pt reports left sided chest pain that radiates down his left arm associated with shortness of breath and blurred vision. CP began a few weeks ago but it was intermittent so he ignored it. The pain returned today when he was at work.  He reports taking testosterone  weekly.

## 2024-09-21 NOTE — Discharge Instructions (Signed)
 It was our pleasure to provide your ER care today - we hope that you feel better.  If GI/reflux symptoms, try taking pepcid and maalox as need for symptom relief.   For recent chest pain, follow up closely with cardiologist in the next 1-2 weeks - we made referral, and they should be contacting you with an appointment in the next few days.   Return to ER right away if worse, new symptoms, fevers, recurrent/persistent chest pain, increased trouble breathing, or other concern.

## 2024-09-21 NOTE — ED Notes (Signed)
 Nurse called CCMD.

## 2024-09-21 NOTE — ED Provider Notes (Signed)
 " [facility] Provider Note   CSN: [id number] Arrival date & time: 09/21/24  1446     Patient presents with: Chest Pain   Thomas Andrade is a 44 y.o. male.   Pt with c/o mid to left chest pain in the past few weeks. Symptoms occur at rest, at times feels as if fluttering and at other times tightness. No nv. No diaphoresis. Felt mildly sob earlier, not now. No specific exacerbating or alleviating factors. No change whether upright or supine. No exertional chest pain. No pleuritic chest pain. No hx cad or fam hx cad. Denies leg swelling, trauma, travel or surgery. No leg pain or swelling. No hx dvt or pe. No cough or uri symptoms. No fevers. No heartburn.   The history is provided by the patient and medical records.  Chest Pain Associated symptoms: no abdominal pain, no back pain, no cough, no fever, no headache and no vomiting        Prior to Admission medications  Medication Sig Start Date End Date Taking? Authorizing Provider  doxycycline (VIBRAMYCIN) 100 MG capsule Take 100 mg by mouth 2 (two) times daily. 07/03/23   [provider]  methocarbamol  (ROBAXIN ) 500 MG tablet Take 1 tablet (500 mg total) by mouth 3 (three) times daily. 05/24/23   Margrette Taft BRAVO, MD  testosterone  cypionate (DEPOTESTOSTERONE CYPIONATE) 200 MG/ML injection Inject 200 mg into the muscle once a week. 04/10/23   [provider]    Allergies: Penicillins and Shellfish allergy    Review of Systems  Constitutional:  Negative for chills and fever.  HENT:  Negative for sore throat.   Respiratory:  Negative for cough.   Cardiovascular:  Positive for chest pain. Negative for leg swelling.  Gastrointestinal:  Negative for abdominal pain, diarrhea and vomiting.  Genitourinary:  Negative for flank pain.  Musculoskeletal:  Negative for back pain and neck pain.  Neurological:  Negative for syncope and headaches.  Hematological:  Does not bruise/bleed  easily.    Updated Vital Signs BP 120/89   Pulse 96   Temp 98.4 F (36.9 C) (Oral)   Resp 18   Ht 1.803 m (5' 11)   Wt 111.1 kg   SpO2 92%   BMI 34.17 kg/m   Physical Exam Vitals and nursing note reviewed.  Constitutional:      Appearance: Normal appearance. He is well-developed.  HENT:     Head: Atraumatic.     Nose: Nose normal.     Mouth/Throat:     Mouth: Mucous membranes are moist.     Pharynx: Oropharynx is clear.  Eyes:     General: No scleral icterus.    Conjunctiva/sclera: Conjunctivae normal.  Neck:     Trachea: No tracheal deviation.  Cardiovascular:     Rate and Rhythm: Normal rate and regular rhythm.     Pulses: Normal pulses.     Heart sounds: Normal heart sounds. No murmur heard.    No friction rub. No gallop.  Pulmonary:     Effort: Pulmonary effort is normal. No accessory muscle usage or respiratory distress.     Breath sounds: Normal breath sounds.  Chest:     Chest wall: No tenderness.  Abdominal:     General: Bowel sounds are normal. There is no distension.     Palpations: Abdomen is soft.     Tenderness: There is no abdominal tenderness. There is no guarding.  Musculoskeletal:  General: No swelling or tenderness.     Cervical back: Normal range of motion and neck supple. No rigidity.     Right lower leg: No edema.     Left lower leg: No edema.  Skin:    General: Skin is warm and dry.     Findings: No rash.  Neurological:     Mental Status: He is alert.     Comments: Alert, speech clear.   Psychiatric:        Mood and Affect: Mood normal.     (all labs ordered are listed, but only abnormal results are displayed) Results for orders placed or performed during the hospital encounter of 09/21/24  CBC with Differential   Collection Time: 09/21/24  3:02 PM  Result Value Ref Range   WBC 7.0 4.0 - 10.5 K/uL   RBC 4.90 4.22 - 5.81 MIL/uL   Hemoglobin 16.1 13.0 - 17.0 g/dL   HCT 54.2 60.9 - 47.9 %   MCV 93.3 80.0 - 100.0 fL   MCH  32.9 26.0 - 34.0 pg   MCHC 35.2 30.0 - 36.0 g/dL   RDW 86.8 88.4 - 84.4 %   Platelets 224 150 - 400 K/uL   nRBC 0.0 0.0 - 0.2 %   Neutrophils Relative % 55 %   Neutro Abs 3.9 1.7 - 7.7 K/uL   Lymphocytes Relative 32 %   Lymphs Abs 2.2 0.7 - 4.0 K/uL   Monocytes Relative 8 %   Monocytes Absolute 0.5 0.1 - 1.0 K/uL   Eosinophils Relative 4 %   Eosinophils Absolute 0.3 0.0 - 0.5 K/uL   Basophils Relative 1 %   Basophils Absolute 0.1 0.0 - 0.1 K/uL   Immature Granulocytes 0 %   Abs Immature Granulocytes 0.01 0.00 - 0.07 K/uL  Comprehensive metabolic panel   Collection Time: 09/21/24  3:02 PM  Result Value Ref Range   Sodium 141 135 - 145 mmol/L   Potassium 3.9 3.5 - 5.1 mmol/L   Chloride 105 98 - 111 mmol/L   CO2 24 22 - 32 mmol/L   Glucose, Bld 93 70 - 99 mg/dL   BUN 14 6 - 20 mg/dL   Creatinine, Ser 8.84 0.61 - 1.24 mg/dL   Calcium 8.7 (L) 8.9 - 10.3 mg/dL   Total Protein 7.1 6.5 - 8.1 g/dL   Albumin 4.5 3.5 - 5.0 g/dL   AST 31 15 - 41 U/L   ALT 25 0 - 44 U/L   Alkaline Phosphatase 49 38 - 126 U/L   Total Bilirubin 0.7 0.0 - 1.2 mg/dL   GFR, Estimated >39 >39 mL/min   Anion gap 12 5 - 15  Troponin T, High Sensitivity   Collection Time: 09/21/24  3:02 PM  Result Value Ref Range   Troponin T High Sensitivity 12 0 - 19 ng/L  Troponin T, High Sensitivity   Collection Time: 09/21/24  4:45 PM  Result Value Ref Range   Troponin T High Sensitivity 12 0 - 19 ng/L     EKG: EKG Interpretation Date/Time:  Sunday September 21 2024 14:56:54 EST Ventricular Rate:  102 PR Interval:  147 QRS Duration:  98 QT Interval:  323 QTC Calculation: 421 R Axis:   46  Text Interpretation: Sinus tachycardia Confirmed by Bernard Drivers ([id number]) on 09/21/2024 3:29:38 PM  Radiology: [facility] Chest Port 1 View Result Date: 09/21/2024 EXAM: 1 VIEW XRAY OF THE CHEST 09/21/2024 03:21:00 PM COMPARISON: Comparison with 12/08/2009. CLINICAL HISTORY: Left-sided chest pain radiating to the  left arm with  shortness of breath and blurred vision. Pain began a few weeks ago. FINDINGS: LUNGS AND PLEURA: No focal pulmonary opacity. No pleural effusion. No pneumothorax. HEART AND MEDIASTINUM: Heart size and pulmonary vascularity are normal. Mediastinal contours appear intact. BONES AND SOFT TISSUES: No acute osseous abnormality. IMPRESSION: 1. No acute cardiopulmonary abnormality. Electronically signed by: Elsie Gravely MD 09/21/2024 03:28 PM EST RP Workstation: [id number]     Procedures   Medications Ordered in the ED  famotidine  (PEPCID ) tablet 20 mg (20 mg Oral Given 09/21/24 1535)  alum & mag hydroxide-simeth (MAALOX/MYLANTA) 200-200-20 MG/5ML suspension 30 mL (30 mLs Oral Given 09/21/24 1535)  acetaminophen  (TYLENOL ) tablet 1,000 mg (1,000 mg Oral Given 09/21/24 1535)                                    Medical Decision Making Problems Addressed: Palpitations: acute illness or injury Precordial chest pain: acute illness or injury with systemic symptoms that poses a threat to life or bodily functions  Amount and/or Complexity of Data Reviewed External Data Reviewed: notes. Labs: ordered. Decision-making details documented in ED Course. Radiology: ordered and independent interpretation performed. Decision-making details documented in ED Course. ECG/medicine tests: ordered and independent interpretation performed. Decision-making details documented in ED Course.  Risk OTC drugs. Decision regarding hospitalization.   Iv ns. Continuous pulse ox and cardiac monitoring. Labs ordered/sent. Imaging ordered.   Differential diagnosis includes acs, msk cp, gi cp, etc. Dispo decision including potential need for admission considered - will get labs and imaging and reassess.   Reviewed nursing notes and prior charts for additional history. External reports reviewed.   Cardiac monitor: sinus rhythm, rate 88.  Meds for symptom relief.   Labs reviewed/interpreted by me - wbc and hgb normal. Chem  normal. Trop normal.   Xrays reviewed/interpreted by me - no pna.   Additional labs reviewed/interpreted by me - delta trop normal and not increasing - felt not c/w acs.  Currently on monitor, hr 78, rr 14, pulse ox 99%.  Rec close pcp/card f/u. Cardiology referral provided.   Return precautions provided.       Final diagnoses:  None    ED Discharge Orders     None          Bernard Drivers, MD 09/21/24 1742  "

## 2024-10-03 ENCOUNTER — Ambulatory Visit: Admitting: Surgical

## 2024-10-03 ENCOUNTER — Encounter: Payer: Self-pay | Admitting: Surgical

## 2024-10-03 DIAGNOSIS — M94212 Chondromalacia, left shoulder: Secondary | ICD-10-CM

## 2024-10-03 DIAGNOSIS — S43432A Superior glenoid labrum lesion of left shoulder, initial encounter: Secondary | ICD-10-CM

## 2024-10-03 NOTE — Progress Notes (Cosign Needed)
 "  Office Visit Note   Patient: Thomas Andrade           Date of Birth: 10/31/1980           MRN: [id number] Visit Date: 10/03/2024 Requested by: Cook, Jayce G, DO [address] [place],  [place] [address] PCP: Bluford Jacqulyn MATSU, DO  Subjective: Chief Complaint  Patient presents with   Left Shoulder - Pain    MRI review    HPI: Thomas Andrade is a 44 y.o. male who presents to the office reporting left shoulder pain.  Patient returns after seeing Dr. Addie several months ago and recently had MRI arthrogram at [facility].  He states that pain is overall better quite substantially than it was when he saw Dr. Addie.  He has laid off of weight lifting over the last several months.  He describes discomfort in the posterolateral aspect of the shoulder with radiation down into the mid humeral region.  He also has numbness and tingling that will extend all the way down from the shoulder into the small finger and ring finger.  No associated neck pain.  He did have some scapular pain when he saw Dr. Addie but currently not having much of this.  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  He does not take any medications for pain.  When he works out, he would like to get back to doing light weights with strength training.  He enjoys doing bench press and incline press but does not ego lift..                ROS: All systems reviewed are negative as they relate to the chief complaint within the history of present illness.  Patient denies fevers or chills.  Assessment & Plan: Visit Diagnoses:  1. Chondromalacia of left shoulder   2. 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  Awaiting MRI report but to my read, there is a SLAP tear that is present (which was  present on previous MRI scan from greater than 10 years ago) as well as some posterior glenoid wear without significant anterior chondromalacia.  There is small subchondral cyst noted in the posterior glenoid.  This correlates with his symptoms that seem to get worse with weight lifting sometimes especially with press movements.  Currently, his symptoms are not that bad and he can tolerate with minimal discomfort his shoulder symptoms in everyday life.  We discussed altering his workout regimen and the exercises that he does and pain attention to how his shoulder feels after different exercises as a good guide for what to avoid.  Specifically avoiding heavy overhead lifting is a good rule of thumb.  Also recommended doing some rotator cuff strengthening exercises.  We will plan for activity modification and patient will follow-up with the office as needed.  If pain flares up, would consider either glenohumeral injection diagnostically/therapeutically versus working up his neck which could be a source of some of the symptoms given the previous scapular pain and the continued numbness and tingling that will occasionally radiate down his entire arm into the small and ring fingers.  Patient agreed with plan.  Follow-up as needed.  Follow-Up Instructions: No follow-ups on file.   Orders:  No  orders of the defined types were placed in this encounter.  No orders of the defined types were placed in this encounter.     Procedures: No procedures performed   Clinical Data: No additional findings.  Objective: Vital Signs: There were no vitals taken for this visit.  Physical Exam:  Constitutional: Patient appears well-developed HEENT:  Head: Normocephalic Eyes:EOM are normal Neck: Normal range of motion Cardiovascular: Normal rate Pulmonary/chest: Effort normal Neurologic: Patient is alert Skin: Skin is warm Psychiatric: Patient has normal mood and affect  Ortho Exam: Ortho exam demonstrates left  shoulder with 45 degrees X rotation, 105 degrees abduction, 150 degrees forward elevation passively and actively.  Excellent rotator cuff strength of supra, infra, subscap with strength rated 5/5 bilaterally.  No reproduction of pain.  He does have some mild tenderness over the bicipital groove.  No significant O'Brien sign.  No pain reproduced with supination strength testing.  No tenderness over the [facility] joint.  Does have pain maximally with terminal external rotation.  No significant crepitus is able to be reproduced though patient does report that some days are more symptomatic than others in regards to his mechanical symptoms.  Specialty Comments:  No specialty comments available.  Imaging: No results found.   PMFS History: Patient Active Problem List   Diagnosis Date Noted   Hypogonadism in male 06/15/2023   Hyperlipidemia 06/15/2023   Low back pain 06/15/2023   Past Medical History:  Diagnosis Date   Dysphagia    Low testosterone     Rectal bleeding     Family History  Problem Relation Age of Onset   Cancer Maternal Aunt    Cancer Paternal Aunt     Past Surgical History:  Procedure Laterality Date   ESOPHAGEAL DILATION N/A 02/01/2015   Procedure: ESOPHAGEAL DILATION;  Surgeon: Claudis RAYMOND Rivet, MD;  [facility];  Service: Endoscopy;  Laterality: N/A;   ESOPHAGOGASTRODUODENOSCOPY N/A 02/01/2015   Procedure: ESOPHAGOGASTRODUODENOSCOPY (EGD);  Surgeon: Claudis RAYMOND Rivet, MD;  Location[facility];  Service: Endoscopy;  Laterality: N/A;  1045   WISDOM TOOTH EXTRACTION     Social History   Occupational History   Occupation: [employer] in [place]  Tobacco Use   Smoking status: Never   Smokeless tobacco: Former    Types: Engineer, Drilling   Vaping status: Never Used  Substance and Sexual Activity   Alcohol use: Yes    Alcohol/week: 3.0 - 4.0 standard drinks of alcohol    Types: 3 - 4 Cans of beer per week    Comment: occasionally   Drug use: No   Sexual  activity: Yes        "
